# Patient Record
Sex: Male | Born: 1975
Health system: Southern US, Community
[De-identification: ages and names within clinical notes are randomized; demographics above are authoritative.]

## PROBLEM LIST (undated history)

## (undated) DIAGNOSIS — E785 Hyperlipidemia, unspecified: Secondary | ICD-10-CM

## (undated) DIAGNOSIS — G44009 Cluster headache syndrome, unspecified, not intractable: Secondary | ICD-10-CM

## (undated) DIAGNOSIS — F419 Anxiety disorder, unspecified: Secondary | ICD-10-CM

## (undated) HISTORY — PX: WISDOM TOOTH EXTRACTION: SHX21

## (undated) HISTORY — PX: NO PAST SURGERIES: SHX2092

## (undated) HISTORY — DX: Anxiety disorder, unspecified: F41.9

## (undated) HISTORY — DX: Hyperlipidemia, unspecified: E78.5

## (undated) HISTORY — DX: Cluster headache syndrome, unspecified, not intractable: G44.009

---

## 2004-08-21 ENCOUNTER — Observation Stay (HOSPITAL_COMMUNITY): Admission: EM | Admit: 2004-08-21 | Discharge: 2004-08-22 | Payer: Self-pay | Admitting: Emergency Medicine

## 2016-04-24 ENCOUNTER — Ambulatory Visit (INDEPENDENT_AMBULATORY_CARE_PROVIDER_SITE_OTHER): Payer: Managed Care, Other (non HMO) | Admitting: Physician Assistant

## 2016-04-24 ENCOUNTER — Encounter: Payer: Self-pay | Admitting: Physician Assistant

## 2016-04-24 VITALS — BP 118/80 | HR 85 | Temp 98.0°F | Resp 16 | Ht 73.75 in | Wt 228.0 lb

## 2016-04-24 DIAGNOSIS — H6983 Other specified disorders of Eustachian tube, bilateral: Secondary | ICD-10-CM

## 2016-04-24 DIAGNOSIS — E785 Hyperlipidemia, unspecified: Secondary | ICD-10-CM | POA: Diagnosis not present

## 2016-04-24 DIAGNOSIS — F411 Generalized anxiety disorder: Secondary | ICD-10-CM | POA: Diagnosis not present

## 2016-04-24 DIAGNOSIS — R42 Dizziness and giddiness: Secondary | ICD-10-CM | POA: Diagnosis not present

## 2016-04-24 LAB — COMPREHENSIVE METABOLIC PANEL
ALK PHOS: 77 U/L (ref 39–117)
ALT: 12 U/L (ref 0–53)
AST: 12 U/L (ref 0–37)
Albumin: 5 g/dL (ref 3.5–5.2)
BUN: 25 mg/dL — ABNORMAL HIGH (ref 6–23)
CO2: 32 meq/L (ref 19–32)
Calcium: 9.8 mg/dL (ref 8.4–10.5)
Chloride: 101 mEq/L (ref 96–112)
Creatinine, Ser: 1.11 mg/dL (ref 0.40–1.50)
GFR: 78.31 mL/min (ref >60.00)
GLUCOSE: 94 mg/dL (ref 70–99)
POTASSIUM: 4.3 meq/L (ref 3.5–5.1)
Sodium: 139 mEq/L (ref 135–145)
TOTAL PROTEIN: 7.4 g/dL (ref 6.0–8.3)
Total Bilirubin: 1.3 mg/dL — ABNORMAL HIGH (ref 0.2–1.2)

## 2016-04-24 LAB — LIPID PANEL
CHOL/HDL RATIO: 6
Cholesterol: 177 mg/dL (ref 0–200)
HDL: 31.1 mg/dL — AB (ref >39.00)
LDL Cholesterol: 107 mg/dL — ABNORMAL HIGH (ref 0–99)
NONHDL: 146.11
Triglycerides: 197 mg/dL — ABNORMAL HIGH (ref 0.0–149.0)
VLDL: 39.4 mg/dL (ref 0.0–40.0)

## 2016-04-24 MED ORDER — LORAZEPAM 1 MG PO TABS: 1.0000 mg | ORAL_TABLET | Freq: Three times a day (TID) | ORAL | 0 refills | Status: DC

## 2016-04-24 MED ORDER — FLUTICASONE PROPIONATE 50 MCG/ACT NA SUSP: 2.0000 | Freq: Every day | NASAL | 6 refills | Status: DC

## 2016-04-24 MED ORDER — ATORVASTATIN CALCIUM 20 MG PO TABS: 20.0000 mg | ORAL_TABLET | Freq: Every day | ORAL | 1 refills | Status: DC

## 2016-04-24 MED ORDER — SERTRALINE HCL 50 MG PO TABS
50.0000 mg | ORAL_TABLET | Freq: Every day | ORAL | 1 refills | Status: DC
Start: 2016-04-24 — End: 2016-10-24

## 2016-04-24 NOTE — Patient Instructions (Signed)
Please go to the lab for blood work. I will call you with your results.  Please continue chronic medications as directed.  For the dizziness -- there is a bit of fluid behind the ear drums which is likely contributing. Start a Claritin-D once daily. Use the Flonase as directed.  Continue the Epley Manuevers below. Your Ativan can help with dizziness. You can use as directed. Do not drive or operate machinery while on this medication.  If symptoms are not improving, we may need to start oral steroids or get input from an ENT.  Epley Maneuver Self-Care WHAT IS THE EPLEY MANEUVER? The Epley maneuver is an exercise you can do to relieve symptoms of benign paroxysmal positional vertigo (BPPV). This condition is often just referred to as vertigo. BPPV is caused by the movement of tiny crystals (canaliths) inside your inner ear. The accumulation and movement of canaliths in your inner ear causes a sudden spinning sensation (vertigo) when you move your head to certain positions. Vertigo usually lasts about 30 seconds. BPPV usually occurs in just one ear. If you get vertigo when you lie on your left side, you probably have BPPV in your left ear. Your health care provider can tell you which ear is involved.  BPPV may be caused by a head injury. Many people older than 50 get BPPV for unknown reasons. If you have been diagnosed with BPPV, your health care provider may teach you how to do this maneuver. BPPV is not life threatening (benign) and usually goes away in time.  WHEN SHOULD I PERFORM THE EPLEY MANEUVER? You can do this maneuver at home whenever you have symptoms of vertigo. You may do the Epley maneuver up to 3 times a day until your symptoms of vertigo go away. HOW SHOULD I DO THE EPLEY MANEUVER? 1. Sit on the edge of a bed or table with your back straight. Your legs should be extended or hanging over the edge of the bed or table.  2. Turn your head halfway toward the affected ear.  3. Lie  backward quickly with your head turned until you are lying flat on your back. You may want to position a pillow under your shoulders.  4. Hold this position for 30 seconds. You may experience an attack of vertigo. This is normal. Hold this position until the vertigo stops. 5. Then turn your head to the opposite direction until your unaffected ear is facing the floor.  6. Hold this position for 30 seconds. You may experience an attack of vertigo. This is normal. Hold this position until the vertigo stops. 7. Now turn your whole body to the same side as your head. Hold for another 30 seconds.  8. You can then sit back up. ARE THERE RISKS TO THIS MANEUVER? In some cases, you may have other symptoms (such as changes in your vision, weakness, or numbness). If you have these symptoms, stop doing the maneuver and call your health care provider. Even if doing these maneuvers relieves your vertigo, you may still have dizziness. Dizziness is the sensation of light-headedness but without the sensation of movement. Even though the Epley maneuver may relieve your vertigo, it is possible that your symptoms will return within 5 years. WHAT SHOULD I DO AFTER THIS MANEUVER? After doing the Epley maneuver, you can return to your normal activities. Ask your doctor if there is anything you should do at home to prevent vertigo. This may include:  Sleeping with two or more pillows to keep  your head elevated.  Not sleeping on the side of your affected ear.  Getting up slowly from bed.  Avoiding sudden movements during the day.  Avoiding extreme head movement, like looking up or bending over.  Wearing a cervical collar to prevent sudden head movements. WHAT SHOULD I DO IF MY SYMPTOMS GET WORSE? Call your health care provider if your vertigo gets worse. Call your provider right way if you have other symptoms, including:   Nausea.  Vomiting.  Headache.  Weakness.  Numbness.  Vision changes. This  information is not intended to replace advice given to you by your health care provider. Make sure you discuss any questions you have with your health care provider. Document Released: 05/18/2013 Document Reviewed: 05/18/2013 Elsevier Interactive Patient Education  2017 [employer]

## 2016-04-24 NOTE — Progress Notes (Signed)
Patient presents to clinic today to establish care.  Acute Concerns: Patient endorses noting a few days of nausea with associated dizziness/lightheadness noted when turning head or getting up too fast. Endorses residual dry cough from infection he had a couple of weeks ago. Denies chest congestion, sinus pressure, sinus pain. Denies ear pressure or pain. Denies decreased hearing, ear drainage, tinnitus. Denies recent travel or sick contact. Endorses staying well hydrated.   Chronic Issues: Generalized Anxiety Disorder -- patient is currently Sertraline 50 mg daily. Is taking as directed. Denies breakthrough symptoms with this regimen. Denies SI/HI.    Hyperlipidemia -- Atorvastatin 20 mg daily. Taking as directed without side effect. Tries to eat a well-balanced diet.   Health Maintenance: Immunizations -- Flu shot and tetanus up-to-date.  Past Medical History:  Diagnosis Date  . Anxiety   . Hyperlipidemia     Past Surgical History:  Procedure Laterality Date  . NO PAST SURGERIES      No current outpatient prescriptions on file prior to visit.   No current facility-administered medications on file prior to visit.     No Known Allergies  Family History  Problem Relation Age of Onset  . Diabetes Sister     x 2  . Stroke Paternal Grandfather     7040s.    Social History   Social History  . Marital status: Married    Spouse name: N/A  . Number of children: N/A  . Years of education: N/A   Occupational History  . Not on file.   Social History Main Topics  . Smoking status: Never Smoker  . Smokeless tobacco: Never Used  . Alcohol use Yes     Comment: rare   . Drug use: No  . Sexual activity: Yes   Other Topics Concern  . Not on file   Social History Narrative   2 daughters -- healthy.    Review of Systems  Constitutional: Negative for chills and fever.  HENT: Negative for ear discharge, ear pain, hearing loss and tinnitus.   Eyes: Negative for blurred  vision and double vision.  Respiratory: Negative for cough and shortness of breath.   Cardiovascular: Negative for chest pain.  Neurological: Positive for dizziness. Negative for tingling, tremors, loss of consciousness and headaches.   BP 118/80   Pulse 85   Temp 98 F (36.7 C) (Oral)   Resp 16   Ht 6' 1.75" (1.873 m)   Wt 228 lb (103.4 kg)   SpO2 95%   BMI 29.47 kg/m   Physical Exam  Constitutional: He is oriented to person, place, and time and well-developed, well-nourished, and in no distress.  HENT:  Head: Normocephalic and atraumatic.  Right Ear: Tympanic membrane is not erythematous and not bulging. A middle ear effusion is present.  Left Ear: Tympanic membrane is not erythematous and not bulging. A middle ear effusion is present.  Nose: Nose normal.  Mouth/Throat: Uvula is midline, oropharynx is clear and moist and mucous membranes are normal.  Negative nystagmus with Dix Hallpike maneuvers but subjective dizziness noted.  Eyes: Conjunctivae are normal.  Neck: Neck supple.  Cardiovascular: Normal rate, regular rhythm, normal heart sounds and intact distal pulses.   Pulmonary/Chest: Effort normal and breath sounds normal. No respiratory distress. He has no wheezes. He has no rales. He exhibits no tenderness.  Neurological: He is alert and oriented to person, place, and time.  Skin: Skin is warm and dry. No rash noted.  Psychiatric: Affect normal.  Vitals  reviewed.  Assessment/Plan: Vertigo Positional with ETD. Start Flonase, Claritin-D and Ecologistpley maneuvers. Supportive measures reviewed. ENT if not improving.   Hyperlipidemia Tolerating statin. Diet and exercise reviewed. Will check lipids and lfts today.  Generalized anxiety disorder Doing very well. Continue current medication regimen. FU 6 months.   Eustachian tube dysfunction, bilateral Will begin Flonase and Claritin-D. If not improving, will consider oral steroid versus ENT assessment.     Piedad ClimesMartin, Bracen Schum  Cody, PA-C

## 2016-04-24 NOTE — Assessment & Plan Note (Signed)
Positional with ETD. Start Flonase, Claritin-D and Ecologistpley maneuvers. Supportive measures reviewed. ENT if not improving.

## 2016-04-24 NOTE — Progress Notes (Signed)
Pre visit review using our clinic review tool, if applicable. No additional management support is needed unless otherwise documented below in the visit note. 

## 2016-04-25 NOTE — Assessment & Plan Note (Signed)
Tolerating statin. Diet and exercise reviewed. Will check lipids and lfts today.

## 2016-04-25 NOTE — Assessment & Plan Note (Signed)
Will begin Flonase and Claritin-D. If not improving, will consider oral steroid versus ENT assessment.

## 2016-04-25 NOTE — Assessment & Plan Note (Signed)
Doing very well. Continue current medication regimen. FU 6 months.

## 2016-04-30 ENCOUNTER — Telehealth: Payer: Self-pay | Admitting: Physician Assistant

## 2016-04-30 DIAGNOSIS — R42 Dizziness and giddiness: Secondary | ICD-10-CM

## 2016-04-30 NOTE — Telephone Encounter (Signed)
Pt states that he is still having vertigo and [facility]

## 2016-05-01 NOTE — Telephone Encounter (Signed)
Referral placed and scheduler will contact patient to set up appointment.

## 2016-05-01 NOTE — Telephone Encounter (Signed)
Per last visit if symptoms not improving referral to ENT

## 2016-05-01 NOTE — Telephone Encounter (Signed)
Ok to place referral for patient ?

## 2016-05-13 ENCOUNTER — Encounter: Payer: Self-pay | Admitting: Physician Assistant

## 2016-05-13 ENCOUNTER — Ambulatory Visit (INDEPENDENT_AMBULATORY_CARE_PROVIDER_SITE_OTHER): Payer: Managed Care, Other (non HMO) | Admitting: Physician Assistant

## 2016-05-13 VITALS — BP 110/78 | HR 78 | Temp 98.2°F | Resp 16 | Ht 73.75 in | Wt 225.0 lb

## 2016-05-13 DIAGNOSIS — R61 Generalized hyperhidrosis: Secondary | ICD-10-CM

## 2016-05-13 LAB — URINALYSIS, ROUTINE W REFLEX MICROSCOPIC
BILIRUBIN URINE: NEGATIVE
HGB URINE DIPSTICK: NEGATIVE
Ketones, ur: NEGATIVE
LEUKOCYTES UA: NEGATIVE
NITRITE: NEGATIVE
RBC / HPF: NONE SEEN (ref 0–?)
Specific Gravity, Urine: >=1.03 — AB (ref 1.000–1.030)
Total Protein, Urine: NEGATIVE
Urine Glucose: NEGATIVE
Urobilinogen, UA: 0.2 (ref 0.0–1.0)
pH: 5.5 (ref 5.0–8.0)

## 2016-05-13 LAB — CBC WITH DIFFERENTIAL/PLATELET
BASOS ABS: 0 10*3/uL (ref 0.0–0.1)
BASOS PCT: 0.6 % (ref 0.0–3.0)
EOS ABS: 0.1 10*3/uL (ref 0.0–0.7)
Eosinophils Relative: 3 % (ref 0.0–5.0)
HCT: 40.6 % (ref 39.0–52.0)
HEMOGLOBIN: 13.9 g/dL (ref 13.0–17.0)
LYMPHS PCT: 36.1 % (ref 12.0–46.0)
Lymphs Abs: 1.7 10*3/uL (ref 0.7–4.0)
MCHC: 34.3 g/dL (ref 30.0–36.0)
MCV: 86.5 fl (ref 78.0–100.0)
MONO ABS: 0.3 10*3/uL (ref 0.1–1.0)
Monocytes Relative: 5.9 % (ref 3.0–12.0)
Neutro Abs: 2.5 10*3/uL (ref 1.4–7.7)
Neutrophils Relative %: 54.4 % (ref 43.0–77.0)
Platelets: 181 10*3/uL (ref 150.0–400.0)
RBC: 4.7 Mil/uL (ref 4.22–5.81)
RDW: 12.4 % (ref 11.5–15.5)
WBC: 4.7 10*3/uL (ref 4.0–10.5)

## 2016-05-13 LAB — COMPREHENSIVE METABOLIC PANEL
ALT: 12 U/L (ref 0–53)
AST: 13 U/L (ref 0–37)
Albumin: 4.7 g/dL (ref 3.5–5.2)
Alkaline Phosphatase: 78 U/L (ref 39–117)
BILIRUBIN TOTAL: 0.9 mg/dL (ref 0.2–1.2)
BUN: 25 mg/dL — AB (ref 6–23)
CO2: 30 meq/L (ref 19–32)
Calcium: 9.6 mg/dL (ref 8.4–10.5)
Chloride: 102 mEq/L (ref 96–112)
Creatinine, Ser: 1 mg/dL (ref 0.40–1.50)
GFR: 88.31 mL/min (ref >60.00)
GLUCOSE: 103 mg/dL — AB (ref 70–99)
POTASSIUM: 4.4 meq/L (ref 3.5–5.1)
SODIUM: 140 meq/L (ref 135–145)
Total Protein: 6.8 g/dL (ref 6.0–8.3)

## 2016-05-13 LAB — TSH: TSH: 1.35 u[IU]/mL (ref 0.35–4.50)

## 2016-05-13 LAB — SEDIMENTATION RATE: Sed Rate: 2 mm/hr (ref 0–15)

## 2016-05-13 LAB — T4, FREE: FREE T4: 0.74 ng/dL (ref 0.60–1.60)

## 2016-05-13 NOTE — Patient Instructions (Signed)
Please go to the lab for blood work. I will call with your results.  Start a journal of the dates you are noting the night sweats. Also check your temperature at bedtime over the next week and anytime you have sweats so we can see if you are spiking a fever at night.   We will proceed with further assessment based on your results.  Please keep us informed if there are any new symptoms noted.

## 2016-05-13 NOTE — Progress Notes (Signed)
Pre visit review using our clinic review tool, if applicable. No additional management support is needed unless otherwise documented below in the visit note. 

## 2016-05-13 NOTE — Progress Notes (Signed)
Patient presents to clinic today c/o night sweats occurring several times per week over the past month. Endorses bed completely drenched. Denies fever, chills. Denies chest pain, SOB, racing heart. Endorses weight loss over the past 1-1.5 months -- Endorses about 20 pound weight loss.  Denies recent travel or sick contact. States symptoms started after he got a cold. Denies any residual URI symptoms. Denies change to bowel/bladder habits. Denies melena, tenesmus or hematochezia. Denies anorexia.   Past Medical History:  Diagnosis Date  . Anxiety   . Hyperlipidemia     Current Outpatient Prescriptions on File Prior to Visit  Medication Sig Dispense Refill  . atorvastatin (LIPITOR) 20 MG tablet Take 1 tablet (20 mg total) by mouth daily. 90 tablet 1  . LORazepam (ATIVAN) 1 MG tablet Take 1 tablet (1 mg total) by mouth every 8 (eight) hours. 30 tablet 0  . sertraline (ZOLOFT) 50 MG tablet Take 1 tablet (50 mg total) by mouth daily. 90 tablet 1   No current facility-administered medications on file prior to visit.     No Known Allergies  Family History  Problem Relation Age of Onset  . Diabetes Sister     x 2  . Stroke Paternal Grandfather     7s.    Social History   Social History  . Marital status: Married    Spouse name: N/A  . Number of children: N/A  . Years of education: N/A   Social History Main Topics  . Smoking status: Never Smoker  . Smokeless tobacco: Never Used  . Alcohol use Yes     Comment: rare   . Drug use: No  . Sexual activity: Yes   Other Topics Concern  . None   Social History Narrative   2 daughters -- healthy.    Review of Systems - See HPI.  All other ROS are negative.  BP 110/78   Pulse 78   Temp 98.2 F (36.8 C) (Oral)   Resp 16   Ht 6' 1.75" (1.873 m)   Wt 225 lb (102.1 kg)   SpO2 95%   BMI 29.08 kg/m   Physical Exam  Constitutional: He is oriented to person, place, and time and well-developed, well-nourished, and in no  distress.  HENT:  Head: Normocephalic and atraumatic.  Right Ear: External ear normal.  Left Ear: External ear normal.  Nose: Nose normal.  Mouth/Throat: Oropharynx is clear and moist. No oropharyngeal exudate.  TM within normal limits bilaterally.   Eyes: Conjunctivae are normal.  Neck: Neck supple. No thyromegaly present.  Cardiovascular: Normal rate, regular rhythm, normal heart sounds and intact distal pulses.   Pulmonary/Chest: Effort normal and breath sounds normal. No respiratory distress. He has no wheezes. He has no rales. He exhibits no tenderness.  Lymphadenopathy:    He has no cervical adenopathy.  Neurological: He is alert and oriented to person, place, and time.  Skin: Skin is warm and dry. No rash noted.  Psychiatric: Affect normal.  Vitals reviewed.   Recent Results (from the past 2160 hour(s))  Lipid Profile     Status: Abnormal   Collection Time: 04/24/16  2:58 PM  Result Value Ref Range   Cholesterol 177 0 - 200 mg/dL    Comment: ATP III Classification       Desirable:  < 200 mg/dL               Borderline High:  200 - 239 mg/dL  High:  > = 240 mg/dL   Triglycerides 197.0 (H) 0.0 - 149.0 mg/dL    Comment: Normal:  <150 mg/dLBorderline High:  150 - 199 mg/dL   HDL 31.10 (L) >39.00 mg/dL   VLDL 39.4 0.0 - 40.0 mg/dL   LDL Cholesterol 107 (H) 0 - 99 mg/dL   Total CHOL/HDL Ratio 6     Comment:                Men          Women1/2 Average Risk     3.4          3.3Average Risk          5.0          4.42X Average Risk          9.6          7.13X Average Risk          15.0          11.0                       NonHDL 146.11     Comment: NOTE:  Non-HDL goal should be 30 mg/dL higher than patient's LDL goal (i.e. LDL goal of < 70 mg/dL, would have non-HDL goal of < 100 mg/dL)  Comp Met (CMET)     Status: Abnormal   Collection Time: 04/24/16  2:58 PM  Result Value Ref Range   Sodium 139 135 - 145 mEq/L   Potassium 4.3 3.5 - 5.1 mEq/L   Chloride 101 96 - 112 mEq/L    CO2 32 19 - 32 mEq/L   Glucose, Bld 94 70 - 99 mg/dL   BUN 25 (H) 6 - 23 mg/dL   Creatinine, Ser 1.11 0.40 - 1.50 mg/dL   Total Bilirubin 1.3 (H) 0.2 - 1.2 mg/dL   Alkaline Phosphatase 77 39 - 117 U/L   AST 12 0 - 37 U/L   ALT 12 0 - 53 U/L   Total Protein 7.4 6.0 - 8.3 g/dL   Albumin 5.0 3.5 - 5.2 g/dL   Calcium 9.8 8.4 - 10.5 mg/dL   GFR 78.31 >60.00 mL/min    Assessment/Plan: 1. Night sweats Unclear. Recent viral URI. Potential relation. Will check lab panel today to include CBC, TSH, CMP, ESR and EBV panel. Patient to keep a journal to log sweats. Also check temperature each evening at bedtime and record over the next week.  - CBC w/Diff - TSH - T4, free - Comp Met (CMET) - Urinalysis, Routine w reflex microscopic - Sed Rate (ESR) - Epstein-Barr virus VCA antibody panel   Leeanne Rio, PA-C

## 2016-05-14 LAB — EPSTEIN-BARR VIRUS VCA ANTIBODY PANEL
EBV NA IGG: 23.8 U/mL — AB
EBV VCA IGG: 110 U/mL — AB

## 2016-05-15 ENCOUNTER — Other Ambulatory Visit: Payer: Self-pay | Admitting: Physician Assistant

## 2016-05-15 ENCOUNTER — Encounter: Payer: Self-pay | Admitting: Physician Assistant

## 2016-05-15 DIAGNOSIS — R61 Generalized hyperhidrosis: Secondary | ICD-10-CM

## 2016-05-16 ENCOUNTER — Ambulatory Visit
Admission: RE | Admit: 2016-05-16 | Discharge: 2016-05-16 | Disposition: A | Discharge status: Home / Self Care | Payer: Managed Care, Other (non HMO) | Source: Ambulatory Visit | Attending: Physician Assistant | Admitting: Physician Assistant

## 2016-05-16 DIAGNOSIS — R61 Generalized hyperhidrosis: Secondary | ICD-10-CM

## 2016-10-24 ENCOUNTER — Encounter: Payer: Self-pay | Admitting: Physician Assistant

## 2016-10-24 ENCOUNTER — Ambulatory Visit (INDEPENDENT_AMBULATORY_CARE_PROVIDER_SITE_OTHER): Payer: Managed Care, Other (non HMO) | Admitting: Physician Assistant

## 2016-10-24 VITALS — BP 110/78 | HR 58 | Temp 97.7°F | Resp 14 | Ht 73.75 in | Wt 225.0 lb

## 2016-10-24 DIAGNOSIS — F411 Generalized anxiety disorder: Secondary | ICD-10-CM | POA: Diagnosis not present

## 2016-10-24 DIAGNOSIS — Z0000 Encounter for general adult medical examination without abnormal findings: Secondary | ICD-10-CM | POA: Diagnosis not present

## 2016-10-24 DIAGNOSIS — Z6829 Body mass index (BMI) 29.0-29.9, adult: Secondary | ICD-10-CM | POA: Diagnosis not present

## 2016-10-24 DIAGNOSIS — E785 Hyperlipidemia, unspecified: Secondary | ICD-10-CM | POA: Diagnosis not present

## 2016-10-24 LAB — URINALYSIS, ROUTINE W REFLEX MICROSCOPIC
BILIRUBIN URINE: NEGATIVE
Hgb urine dipstick: NEGATIVE
KETONES UR: NEGATIVE
LEUKOCYTES UA: NEGATIVE
NITRITE: NEGATIVE
PH: 6 (ref 5.0–8.0)
RBC / HPF: NONE SEEN (ref 0–?)
Specific Gravity, Urine: >=1.03 — AB (ref 1.000–1.030)
TOTAL PROTEIN, URINE-UPE24: NEGATIVE
URINE GLUCOSE: NEGATIVE
UROBILINOGEN UA: 0.2 (ref 0.0–1.0)

## 2016-10-24 LAB — COMPREHENSIVE METABOLIC PANEL
ALBUMIN: 4.9 g/dL (ref 3.5–5.2)
ALK PHOS: 93 U/L (ref 39–117)
ALT: 14 U/L (ref 0–53)
AST: 16 U/L (ref 0–37)
BILIRUBIN TOTAL: 1.3 mg/dL — AB (ref 0.2–1.2)
BUN: 25 mg/dL — AB (ref 6–23)
CO2: 33 mEq/L — ABNORMAL HIGH (ref 19–32)
Calcium: 9.8 mg/dL (ref 8.4–10.5)
Chloride: 102 mEq/L (ref 96–112)
Creatinine, Ser: 0.96 mg/dL (ref 0.40–1.50)
GFR: 92.36 mL/min (ref >60.00)
GLUCOSE: 88 mg/dL (ref 70–99)
Potassium: 4.6 mEq/L (ref 3.5–5.1)
SODIUM: 139 meq/L (ref 135–145)
TOTAL PROTEIN: 7.2 g/dL (ref 6.0–8.3)

## 2016-10-24 LAB — LIPID PANEL
CHOLESTEROL: 186 mg/dL (ref 0–200)
HDL: 42 mg/dL (ref >39.00)
LDL Cholesterol: 104 mg/dL — ABNORMAL HIGH (ref 0–99)
NONHDL: 143.87
Total CHOL/HDL Ratio: 4
Triglycerides: 199 mg/dL — ABNORMAL HIGH (ref 0.0–149.0)
VLDL: 39.8 mg/dL (ref 0.0–40.0)

## 2016-10-24 LAB — CBC
HEMATOCRIT: 41.5 % (ref 39.0–52.0)
HEMOGLOBIN: 14 g/dL (ref 13.0–17.0)
MCHC: 33.8 g/dL (ref 30.0–36.0)
MCV: 88.2 fl (ref 78.0–100.0)
Platelets: 200 10*3/uL (ref 150.0–400.0)
RBC: 4.71 Mil/uL (ref 4.22–5.81)
RDW: 12.9 % (ref 11.5–15.5)
WBC: 5.6 10*3/uL (ref 4.0–10.5)

## 2016-10-24 LAB — HEMOGLOBIN A1C: HEMOGLOBIN A1C: 5.7 % (ref 4.6–6.5)

## 2016-10-24 MED ORDER — SERTRALINE HCL 50 MG PO TABS
50.0000 mg | ORAL_TABLET | Freq: Every day | ORAL | 1 refills | Status: DC
Start: 2016-10-24 — End: 2017-05-30

## 2016-10-24 NOTE — Assessment & Plan Note (Signed)
Depression screen negative. Health Maintenance reviewed -- immunizations up-to-date. Preventive schedule discussed and handout given in AVS. Will obtain fasting labs today.  

## 2016-10-24 NOTE — Assessment & Plan Note (Signed)
Very active. Working on diet. Will follow.

## 2016-10-24 NOTE — Assessment & Plan Note (Signed)
Taking statin as directed. Tolerating without side effect. Will check fasting labs today. Will alter regimen accordingly. Dietary and exercise recommendations reviewed with patient. Follow-up every 6 months.

## 2016-10-24 NOTE — Patient Instructions (Signed)
Please go to the lab for blood work.   Our office will call you with your results unless you have chosen to receive results via MyChart.  If your blood work is normal we will follow-up each year for physicals and as scheduled for chronic medical problems.  If anything is abnormal we will treat accordingly and get you in for a follow-up.  Continue medications as directed.   Preventive Care 18-39 Years, Male Preventive care refers to lifestyle choices and visits with your health care provider that can promote health and wellness. What does preventive care include?  A yearly physical exam. This is also called an annual well check.  Dental exams once or twice a year.  Routine eye exams. Ask your health care provider how often you should have your eyes checked.  Personal lifestyle choices, including: ? Daily care of your teeth and gums. ? Regular physical activity. ? Eating a healthy diet. ? Avoiding tobacco and drug use. ? Limiting alcohol use. ? Practicing safe sex. What happens during an annual well check? The services and screenings done by your health care provider during your annual well check will depend on your age, overall health, lifestyle risk factors, and family history of disease. Counseling Your health care provider may ask you questions about your:  Alcohol use.  Tobacco use.  Drug use.  Emotional well-being.  Home and relationship well-being.  Sexual activity.  Eating habits.  Work and work Statistician.  Screening You may have the following tests or measurements:  Height, weight, and BMI.  Blood pressure.  Lipid and cholesterol levels. These may be checked every 5 years starting at age 28.  Diabetes screening. This is done by checking your blood sugar (glucose) after you have not eaten for a while (fasting).  Skin check.  Hepatitis C blood test.  Hepatitis B blood test.  Sexually transmitted disease (STD) testing.  Discuss your test  results, treatment options, and if necessary, the need for more tests with your health care provider. Vaccines Your health care provider may recommend certain vaccines, such as:  Influenza vaccine. This is recommended every year.  Tetanus, diphtheria, and acellular pertussis (Tdap, Td) vaccine. You may need a Td booster every 10 years.  Varicella vaccine. You may need this if you have not been vaccinated.  HPV vaccine. If you are 20 or younger, you may need three doses over 6 months.  Measles, mumps, and rubella (MMR) vaccine. You may need at least one dose of MMR.You may also need a second dose.  Pneumococcal 13-valent conjugate (PCV13) vaccine. You may need this if you have certain conditions and have not been vaccinated.  Pneumococcal polysaccharide (PPSV23) vaccine. You may need one or two doses if you smoke cigarettes or if you have certain conditions.  Meningococcal vaccine. One dose is recommended if you are age 47-21 years and a first-year college student living in a residence hall, or if you have one of several medical conditions. You may also need additional booster doses.  Hepatitis A vaccine. You may need this if you have certain conditions or if you travel or work in places where you may be exposed to hepatitis A.  Hepatitis B vaccine. You may need this if you have certain conditions or if you travel or work in places where you may be exposed to hepatitis B.  Haemophilus influenzae type b (Hib) vaccine. You may need this if you have certain risk factors.  Talk to your health care provider about which screenings  and vaccines you need and how often you need them. This information is not intended to replace advice given to you by your health care provider. Make sure you discuss any questions you have with your health care provider. Document Released: 07/09/2001 Document Revised: 01/31/2016 Document Reviewed: 03/14/2015 Elsevier Interactive Patient Education  2017 [employer].

## 2016-10-24 NOTE — Assessment & Plan Note (Signed)
Doing very well on current regimen. No alarm signs/symptoms. Continue current regimen.

## 2016-10-24 NOTE — Progress Notes (Signed)
Patient presents to clinic today for annual exam.  Patient is fasting for labs. Diet -- Endorses well-balanced diet overall. Denies intake of fast food. Watching portion sizes.  Exercise -- Plays hockey and softball weekly. [place] in between.  Body mass index is 29.08 kg/m.  Acute Concerns: Denies acute concerns at today's visit.  Chronic Issues: Anxiety -- Is currently on a regimen. Denies SI/HI.   Hyperlipidemia -- Taking Lipitor 20 mg daily without side effect. Is not currently on ASA. Is staying active and watching diet.   Health Maintenance: Immunizations -- up-to-date   Past Medical History:  Diagnosis Date  . Anxiety   . Hyperlipidemia     Past Surgical History:  Procedure Laterality Date  . NO PAST SURGERIES      Current Outpatient Prescriptions on File Prior to Visit  Medication Sig Dispense Refill  . atorvastatin (LIPITOR) 20 MG tablet Take 1 tablet (20 mg total) by mouth daily. 90 tablet 1  . LORazepam (ATIVAN) 1 MG tablet Take 1 tablet (1 mg total) by mouth every 8 (eight) hours. 30 tablet 0  . sertraline (ZOLOFT) 50 MG tablet Take 1 tablet (50 mg total) by mouth daily. 90 tablet 1   No current facility-administered medications on file prior to visit.     No Known Allergies  Family History  Problem Relation Age of Onset  . Diabetes Sister        x 2  . Stroke Paternal Grandfather        440s.    Social History   Social History  . Marital status: Married    Spouse name: N/A  . Number of children: N/A  . Years of education: N/A   Occupational History  . Not on file.   Social History Main Topics  . Smoking status: Never Smoker  . Smokeless tobacco: Never Used  . Alcohol use Yes     Comment: rare   . Drug use: No  . Sexual activity: Yes   Other Topics Concern  . Not on file   Social History Narrative   2 daughters -- healthy.    Review of Systems  Constitutional: Negative for fever and weight loss.  HENT: Negative for ear  discharge, ear pain, hearing loss and tinnitus.   Eyes: Negative for blurred vision, double vision, photophobia and pain.  Respiratory: Negative for cough and shortness of breath.   Cardiovascular: Negative for chest pain and palpitations.  Gastrointestinal: Negative for abdominal pain, blood in stool, constipation, diarrhea, heartburn, melena, nausea and vomiting.  Genitourinary: Negative for dysuria, flank pain, frequency, hematuria and urgency.  Musculoskeletal: Negative for falls.  Neurological: Negative for dizziness, loss of consciousness and headaches.  Endo/Heme/Allergies: Negative for environmental allergies.  Psychiatric/Behavioral: Negative for depression, hallucinations, substance abuse and suicidal ideas. The patient is not nervous/anxious and does not have insomnia.    BP 110/78   Pulse (!) 58   Temp 97.7 F (36.5 C) (Oral)   Resp 14   Ht 6' 1.75" (1.873 m)   Wt 225 lb (102.1 kg)   SpO2 98%   BMI 29.08 kg/m   Physical Exam  Constitutional: He is oriented to person, place, and time and well-developed, well-nourished, and in no distress.  HENT:  Head: Normocephalic and atraumatic.  Right Ear: External ear normal.  Left Ear: External ear normal.  Nose: Nose normal.  Mouth/Throat: Oropharynx is clear and moist. No oropharyngeal exudate.  Eyes: Conjunctivae and EOM are normal. Pupils are equal, round, and  reactive to light.  Neck: Neck supple. No thyromegaly present.  Cardiovascular: Normal rate, regular rhythm, normal heart sounds and intact distal pulses.   Pulmonary/Chest: Effort normal and breath sounds normal. No respiratory distress. He has no wheezes. He has no rales. He exhibits no tenderness.  Abdominal: Soft. Bowel sounds are normal. He exhibits no distension and no mass. There is no tenderness. There is no rebound and no guarding.  Genitourinary: Testes/scrotum normal and penis normal. No discharge found.  Lymphadenopathy:    He has no cervical adenopathy.    Neurological: He is alert and oriented to person, place, and time.  Skin: Skin is warm and dry. No rash noted.  Psychiatric: Affect normal.  Vitals reviewed.  Assessment/Plan: Visit for preventive health examination Depression screen negative. Health Maintenance reviewed -- immunizations up-to-date. Preventive schedule discussed and handout given in AVS. Will obtain fasting labs today.   Hyperlipidemia Taking statin as directed. Tolerating without side effect. Will check fasting labs today. Will alter regimen accordingly. Dietary and exercise recommendations reviewed with patient. Follow-up every 6 months.  Generalized anxiety disorder Doing very well on current regimen. No alarm signs/symptoms. Continue current regimen.   BMI 29.0-29.9,adult Very active. Working on diet. Will follow.     Piedad Climes, PA-C

## 2016-10-24 NOTE — Progress Notes (Signed)
Pre visit review using our clinic review tool, if applicable. No additional management support is needed unless otherwise documented below in the visit note. 

## 2017-01-25 ENCOUNTER — Other Ambulatory Visit: Payer: Self-pay | Admitting: Physician Assistant

## 2017-01-25 DIAGNOSIS — E785 Hyperlipidemia, unspecified: Secondary | ICD-10-CM

## 2017-05-28 ENCOUNTER — Other Ambulatory Visit: Payer: Self-pay

## 2017-05-28 ENCOUNTER — Ambulatory Visit (INDEPENDENT_AMBULATORY_CARE_PROVIDER_SITE_OTHER): Payer: Managed Care, Other (non HMO) | Admitting: Physician Assistant

## 2017-05-28 ENCOUNTER — Encounter: Payer: Self-pay | Admitting: Physician Assistant

## 2017-05-28 VITALS — BP 118/70 | HR 91 | Temp 97.9°F | Resp 14 | Ht 73.75 in | Wt 235.0 lb

## 2017-05-28 DIAGNOSIS — E785 Hyperlipidemia, unspecified: Secondary | ICD-10-CM | POA: Diagnosis not present

## 2017-05-28 DIAGNOSIS — G44019 Episodic cluster headache, not intractable: Secondary | ICD-10-CM | POA: Diagnosis not present

## 2017-05-28 DIAGNOSIS — F411 Generalized anxiety disorder: Secondary | ICD-10-CM

## 2017-05-28 MED ORDER — METHYLPREDNISOLONE 4 MG PO TBPK: ORAL_TABLET | ORAL | 0 refills | Status: DC

## 2017-05-28 MED ORDER — SUMATRIPTAN SUCCINATE 50 MG PO TABS: 50.0000 mg | ORAL_TABLET | Freq: Once | ORAL | 0 refills | Status: DC

## 2017-05-28 NOTE — Patient Instructions (Signed)
Please return tomorrow morning for fasting labs. I will call with results and print out refills for you to pick up.  For the headaches, make sure to keep well-hydrated. Start the steroid taper as directed. Imitrex as directed if needed for acute headache. If still occurring we will need Neurology assessment.

## 2017-05-28 NOTE — Assessment & Plan Note (Signed)
Doing very well. Continue current regimen.  

## 2017-05-28 NOTE — Progress Notes (Signed)
Patient presents to clinic today for follow-up of chronic issues. Body mass index is 30.38 kg/m. Diet -- Endorses well-balanced diet overall. Is working on portion sizes. Exercise -- Hockey 2-3 x week.  Hyperlipidemia -- Is taking Atorvastatin 20 mg daily. Is playing hockey 2-3 x week. Is working on increased exercise.   Generalized Anxiety Disorder -- Doing very well on current regimen of Sertraline 50 mg daily. Has Ativan for PRN use but has not needed in > 1 year. Denies depressed mood or anhedonia. Denies SI/HI.   Patient also with acute concerns today. Patient endorses history of cluster headaches, starting in his mid 62s. Has become less of a frequent issue with last cluster over a year ago. Endorses daily headaches over the past few days occurring behind the right eye, always occurring at the same time of day and associated with redness of eye and eye tearing. Was seen by Neurology previously and given steroid taper and imitrex which works very well. Patient denies any vision changes, AMS or abnormal coordination. Denies photophobia, phonophobia, nausea or vomiting.  Past Medical History:  Diagnosis Date  . Anxiety   . Cluster headaches   . Hyperlipidemia     Current Outpatient Medications on File Prior to Visit  Medication Sig Dispense Refill  . atorvastatin (LIPITOR) 20 MG tablet TAKE 1 TABLET(20 MG) BY MOUTH DAILY 90 tablet 1  . LORazepam (ATIVAN) 1 MG tablet Take 1 tablet (1 mg total) by mouth every 8 (eight) hours. 30 tablet 0  . PROAIR HFA 108 (90 Base) MCG/ACT inhaler INHALE 2 PUFFS Q 4 H FOR 7 DAYS  0  . sertraline (ZOLOFT) 50 MG tablet Take 1 tablet (50 mg total) by mouth daily. 90 tablet 1   No current facility-administered medications on file prior to visit.     No Known Allergies  Family History  Problem Relation Age of Onset  . Diabetes Sister        x 2  . Stroke Paternal Grandfather        79s.    Social History   Socioeconomic History  . Marital  status: Married    Spouse name: None  . Number of children: None  . Years of education: None  . Highest education level: None  Social Needs  . Financial resource strain: None  . Food insecurity - worry: None  . Food insecurity - inability: None  . Transportation needs - medical: None  . Transportation needs - non-medical: None  Occupational History  . None  Tobacco Use  . Smoking status: Never Smoker  . Smokeless tobacco: Never Used  Substance and Sexual Activity  . Alcohol use: Yes    Comment: rare   . Drug use: No  . Sexual activity: Yes  Other Topics Concern  . None  Social History Narrative   2 daughters -- healthy.    Review of Systems - See HPI.  All other ROS are negative.  BP 118/70   Pulse 91   Temp 97.9 F (36.6 C) (Oral)   Resp 14   Ht 6' 1.75" (1.873 m)   Wt 235 lb (106.6 kg)   SpO2 98%   BMI 30.38 kg/m   Physical Exam  Constitutional: He is oriented to person, place, and time and well-developed, well-nourished, and in no distress.  HENT:  Head: Normocephalic and atraumatic.  Right Ear: External ear normal.  Left Ear: External ear normal.  Nose: Nose normal.  Mouth/Throat: Oropharynx is clear and moist. No  oropharyngeal exudate.  TM within normal limits bilaterally.  Eyes: Conjunctivae are normal.  Neck: Neck supple.  Cardiovascular: Normal rate, regular rhythm, normal heart sounds and intact distal pulses.  Pulmonary/Chest: Effort normal and breath sounds normal. No respiratory distress. He has no wheezes. He has no rales. He exhibits no tenderness.  Neurological: He is alert and oriented to person, place, and time.  Skin: Skin is warm and dry. No rash noted.  Psychiatric: Affect normal.  Vitals reviewed.  Assessment/Plan: Episodic cluster headache, not intractable Neuro exam without abnormal findings. Medrol taper. Imitrex for abortive therapy. Supportive measures reviewed. Neuro referral if not resolving.  Hyperlipidemia Orders for fasting  lipids placed. Dietary and exercise regimen reviewed.  Generalized anxiety disorder Doing very well. Continue current regimen.    Piedad ClimesWilliam Cody Merilynn Haydu, PA-C

## 2017-05-28 NOTE — Assessment & Plan Note (Signed)
Orders for fasting lipids placed. Dietary and exercise regimen reviewed.

## 2017-05-28 NOTE — Assessment & Plan Note (Signed)
Neuro exam without abnormal findings. Medrol taper. Imitrex for abortive therapy. Supportive measures reviewed. Neuro referral if not resolving.

## 2017-05-29 ENCOUNTER — Other Ambulatory Visit (INDEPENDENT_AMBULATORY_CARE_PROVIDER_SITE_OTHER): Payer: Managed Care, Other (non HMO)

## 2017-05-29 ENCOUNTER — Other Ambulatory Visit: Payer: Self-pay | Admitting: Physician Assistant

## 2017-05-29 DIAGNOSIS — E785 Hyperlipidemia, unspecified: Secondary | ICD-10-CM | POA: Diagnosis not present

## 2017-05-29 DIAGNOSIS — R7309 Other abnormal glucose: Secondary | ICD-10-CM

## 2017-05-29 LAB — COMPREHENSIVE METABOLIC PANEL
ALBUMIN: 4.9 g/dL (ref 3.5–5.2)
ALK PHOS: 84 U/L (ref 39–117)
ALT: 35 U/L (ref 0–53)
AST: 27 U/L (ref 0–37)
BILIRUBIN TOTAL: 1 mg/dL (ref 0.2–1.2)
BUN: 22 mg/dL (ref 6–23)
CO2: 29 mEq/L (ref 19–32)
CREATININE: 1 mg/dL (ref 0.40–1.50)
Calcium: 10.2 mg/dL (ref 8.4–10.5)
Chloride: 101 mEq/L (ref 96–112)
GFR: 87.84 mL/min (ref >60.00)
Glucose, Bld: 125 mg/dL — ABNORMAL HIGH (ref 70–99)
Potassium: 5 mEq/L (ref 3.5–5.1)
SODIUM: 138 meq/L (ref 135–145)
TOTAL PROTEIN: 7.4 g/dL (ref 6.0–8.3)

## 2017-05-29 LAB — LIPID PANEL
CHOLESTEROL: 213 mg/dL — AB (ref 0–200)
HDL: 48.8 mg/dL (ref >39.00)
LDL Cholesterol: 143 mg/dL — ABNORMAL HIGH (ref 0–99)
NonHDL: 164.55
Total CHOL/HDL Ratio: 4
Triglycerides: 107 mg/dL (ref 0.0–149.0)
VLDL: 21.4 mg/dL (ref 0.0–40.0)

## 2017-05-29 LAB — HEMOGLOBIN A1C: HEMOGLOBIN A1C: 5.7 % (ref 4.6–6.5)

## 2017-05-30 ENCOUNTER — Encounter: Payer: Self-pay | Admitting: Emergency Medicine

## 2017-05-30 ENCOUNTER — Other Ambulatory Visit: Payer: Self-pay | Admitting: Physician Assistant

## 2017-05-30 DIAGNOSIS — F411 Generalized anxiety disorder: Secondary | ICD-10-CM

## 2017-05-30 DIAGNOSIS — E785 Hyperlipidemia, unspecified: Secondary | ICD-10-CM

## 2017-05-30 MED ORDER — ATORVASTATIN CALCIUM 40 MG PO TABS: 40.0000 mg | ORAL_TABLET | Freq: Every day | ORAL | 0 refills | Status: DC

## 2017-05-30 MED ORDER — SERTRALINE HCL 50 MG PO TABS: 50.0000 mg | ORAL_TABLET | Freq: Every day | ORAL | 1 refills | Status: DC

## 2017-06-20 ENCOUNTER — Other Ambulatory Visit: Payer: Self-pay | Admitting: Physician Assistant

## 2017-06-20 DIAGNOSIS — F411 Generalized anxiety disorder: Secondary | ICD-10-CM

## 2017-07-07 ENCOUNTER — Other Ambulatory Visit (INDEPENDENT_AMBULATORY_CARE_PROVIDER_SITE_OTHER): Payer: Self-pay

## 2017-07-07 DIAGNOSIS — E785 Hyperlipidemia, unspecified: Secondary | ICD-10-CM

## 2017-07-07 LAB — HEPATIC FUNCTION PANEL
ALT: 23 U/L (ref 0–53)
AST: 15 U/L (ref 0–37)
Albumin: 4.5 g/dL (ref 3.5–5.2)
Alkaline Phosphatase: 91 U/L (ref 39–117)
BILIRUBIN DIRECT: 0.1 mg/dL (ref 0.0–0.3)
BILIRUBIN TOTAL: 0.8 mg/dL (ref 0.2–1.2)
Total Protein: 6.8 g/dL (ref 6.0–8.3)

## 2017-07-07 LAB — LIPID PANEL
CHOL/HDL RATIO: 5
Cholesterol: 192 mg/dL (ref 0–200)
HDL: 36.2 mg/dL — ABNORMAL LOW (ref >39.00)
NonHDL: 156.22
Triglycerides: 286 mg/dL — ABNORMAL HIGH (ref 0.0–149.0)
VLDL: 57.2 mg/dL — AB (ref 0.0–40.0)

## 2017-07-07 LAB — LDL CHOLESTEROL, DIRECT: Direct LDL: 119 mg/dL

## 2017-07-08 ENCOUNTER — Encounter: Payer: Self-pay | Admitting: Emergency Medicine

## 2017-10-14 ENCOUNTER — Ambulatory Visit: Payer: BLUE CROSS/BLUE SHIELD | Admitting: Physician Assistant

## 2017-10-14 ENCOUNTER — Encounter: Payer: Self-pay | Admitting: Physician Assistant

## 2017-10-14 ENCOUNTER — Other Ambulatory Visit: Payer: Self-pay

## 2017-10-14 VITALS — BP 104/64 | HR 68 | Temp 97.9°F | Resp 15 | Ht 73.75 in | Wt 241.2 lb

## 2017-10-14 DIAGNOSIS — E785 Hyperlipidemia, unspecified: Secondary | ICD-10-CM

## 2017-10-14 LAB — LIPID PANEL
CHOL/HDL RATIO: 5
CHOLESTEROL: 164 mg/dL (ref 0–200)
HDL: 35.8 mg/dL — AB (ref >39.00)
LDL Cholesterol: 92 mg/dL (ref 0–99)
NonHDL: 128.18
TRIGLYCERIDES: 182 mg/dL — AB (ref 0.0–149.0)
VLDL: 36.4 mg/dL (ref 0.0–40.0)

## 2017-10-14 NOTE — Patient Instructions (Signed)
Keep working on increasing aerobic exercise to goal of 150 minutes. Great job with dietary changes. Keep this up! Try to drink more water.   Please go to the lab today for blood work.  I will call you with your results. We will alter treatment regimen(s) if indicated by your results.    Cholesterol Cholesterol is a fat. Your body needs a small amount of cholesterol. Cholesterol (plaque) may build up in your blood vessels (arteries). That makes you more likely to have a heart attack or stroke. You cannot feel your cholesterol level. Having a blood test is the only way to find out if your level is high. Keep your test results. Work with your doctor to keep your cholesterol at a good level. What do the results mean?  Total cholesterol is how much cholesterol is in your blood.  LDL is bad cholesterol. This is the type that can build up. Try to have low LDL.  HDL is good cholesterol. It cleans your blood vessels and carries LDL away. Try to have high HDL.  Triglycerides are fat that the body can store or burn for energy. What are good levels of cholesterol?  Total cholesterol below 200.  LDL below 100 is good for people who have health risks. LDL below 70 is good for people who have very high risks.  HDL above 40 is good. It is best to have HDL of 60 or higher.  Triglycerides below 150. How can I lower my cholesterol? Diet Follow your diet program as told by your doctor.  Choose fish, white meat chicken, or [place] that is roasted or baked. Try not to eat red meat, fried foods, sausage, or lunch meats.  Eat lots of fresh fruits and vegetables.  Choose whole grains, beans, pasta, potatoes, and cereals.  Choose olive oil, corn oil, or canola oil. Only use small amounts.  Try not to eat butter, mayonnaise, shortening, or palm kernel oils.  Try not to eat foods with trans fats.  Choose low-fat or nonfat dairy foods. ? Drink skim or nonfat milk. ? Eat low-fat or nonfat yogurt and  cheeses. ? Try not to drink whole milk or cream. ? Try not to eat ice cream, egg yolks, or full-fat cheeses.  Healthy desserts include angel food cake, ginger snaps, animal crackers, hard candy, popsicles, and low-fat or nonfat frozen yogurt. Try not to eat pastries, cakes, pies, and cookies.  Exercise Follow your exercise program as told by your doctor.  Be more active. Try gardening, walking, and taking the stairs.  Ask your doctor about ways that you can be more active.  Medicine  Take over-the-counter and prescription medicines only as told by your doctor. This information is not intended to replace advice given to you by your health care provider. Make sure you discuss any questions you have with your health care provider. Document Released: 08/09/2008 Document Revised: 12/13/2015 Document Reviewed: 11/23/2015 Elsevier Interactive Patient Education  [employer].

## 2017-10-14 NOTE — Assessment & Plan Note (Signed)
Has made significant dietary changes.  Needs to work harder on exercise. Recommendations given.  Continue atorvastatin 40 mg daily.  Repeat fasting lipids today.

## 2017-10-14 NOTE — Progress Notes (Signed)
Patient presents to clinic today for follow-up of hyperlipidemia. Has cut out fast food and is watching portion sizes. Is exercising some but still not on a regular basis. Notes weight is about the same so he needs to work harder on exercise. Is taking his atorvastatin 40 mg daily as directed. Is still tolerating well.  Lab Results  Component Value Date   CHOL 192 07/07/2017   HDL 36.20 (L) 07/07/2017   LDLCALC 143 (H) 05/29/2017   LDLDIRECT 119.0 07/07/2017   TRIG 286.0 (H) 07/07/2017   CHOLHDL 5 07/07/2017    Past Medical History:  Diagnosis Date  . Anxiety   . Cluster headaches   . Hyperlipidemia     Current Outpatient Medications on File Prior to Visit  Medication Sig Dispense Refill  . atorvastatin (LIPITOR) 40 MG tablet Take 1 tablet (40 mg total) by mouth daily. 90 tablet 0  . Omega-3 Fatty Acids (FISH OIL) 1200 MG CAPS Take 1 capsule by mouth daily. Takes 1400 mg daily    . sertraline (ZOLOFT) 50 MG tablet TAKE 1 TABLET(50 MG) BY MOUTH DAILY 90 tablet 0  . LORazepam (ATIVAN) 1 MG tablet Take 1 tablet (1 mg total) by mouth every 8 (eight) hours. (Patient not taking: Reported on 10/14/2017) 30 tablet 0  . SUMAtriptan (IMITREX) 50 MG tablet Take 1 tablet (50 mg total) by mouth once for 1 dose. May repeat in 2 hours if headache persists or recurs. 10 tablet 0   No current facility-administered medications on file prior to visit.     No Known Allergies  Family History  Problem Relation Age of Onset  . Diabetes Sister        x 2  . Stroke Paternal Grandfather        4s.    Social History   Socioeconomic History  . Marital status: Married    Spouse name: Not on file  . Number of children: Not on file  . Years of education: Not on file  . Highest education level: Not on file  Occupational History  . Not on file  Social Needs  . Financial resource strain: Not on file  . Food insecurity:    Worry: Not on file    Inability: Not on file  . Transportation needs:      Medical: Not on file    Non-medical: Not on file  Tobacco Use  . Smoking status: Never Smoker  . Smokeless tobacco: Never Used  Substance and Sexual Activity  . Alcohol use: Yes    Comment: rare   . Drug use: No  . Sexual activity: Yes  Lifestyle  . Physical activity:    Days per week: Not on file    Minutes per session: Not on file  . Stress: Not on file  Relationships  . Social connections:    Talks on phone: Not on file    Gets together: Not on file    Attends religious service: Not on file    Active member of club or organization: Not on file    Attends meetings of clubs or organizations: Not on file    Relationship status: Not on file  Other Topics Concern  . Not on file  Social History Narrative   2 daughters -- healthy.    Review of Systems - See HPI.  All other ROS are negative.  BP 104/64   Pulse 68   Temp 97.9 F (36.6 C) (Oral)   Resp 15   Ht  6' 1.75" (1.873 m)   Wt 241 lb 3.2 oz (109.4 kg)   SpO2 97%   BMI 31.18 kg/m   Physical Exam  Constitutional: He appears well-developed and well-nourished.  HENT:  Head: Normocephalic and atraumatic.  Eyes: Conjunctivae are normal.  Cardiovascular: Normal rate, regular rhythm and normal heart sounds.  Pulmonary/Chest: Effort normal.  Psychiatric: He has a normal mood and affect.  Vitals reviewed.  Assessment/Plan: Hyperlipidemia Has made significant dietary changes.  Needs to work harder on exercise. Recommendations given.  Continue atorvastatin 40 mg daily.  Repeat fasting lipids today.     Piedad Climes, PA-C

## 2017-10-17 ENCOUNTER — Encounter: Payer: Self-pay | Admitting: Physician Assistant

## 2017-10-17 ENCOUNTER — Other Ambulatory Visit: Payer: Self-pay | Admitting: Emergency Medicine

## 2017-10-17 DIAGNOSIS — F411 Generalized anxiety disorder: Secondary | ICD-10-CM

## 2017-10-17 DIAGNOSIS — E785 Hyperlipidemia, unspecified: Secondary | ICD-10-CM

## 2017-10-17 MED ORDER — ATORVASTATIN CALCIUM 40 MG PO TABS: 40.0000 mg | ORAL_TABLET | Freq: Every day | ORAL | 1 refills | Status: DC

## 2017-10-17 MED ORDER — SERTRALINE HCL 50 MG PO TABS: ORAL_TABLET | ORAL | 1 refills | Status: DC

## 2018-01-28 ENCOUNTER — Encounter: Payer: Self-pay | Admitting: Physician Assistant

## 2018-01-29 ENCOUNTER — Other Ambulatory Visit: Payer: Self-pay

## 2018-01-29 DIAGNOSIS — E785 Hyperlipidemia, unspecified: Secondary | ICD-10-CM

## 2018-01-29 DIAGNOSIS — F411 Generalized anxiety disorder: Secondary | ICD-10-CM

## 2018-01-29 MED ORDER — SERTRALINE HCL 50 MG PO TABS: ORAL_TABLET | ORAL | 1 refills | Status: DC

## 2018-01-29 MED ORDER — ATORVASTATIN CALCIUM 40 MG PO TABS
40.0000 mg | ORAL_TABLET | Freq: Every day | ORAL | 1 refills | Status: DC
Start: 2018-01-29 — End: 2018-07-29

## 2018-03-11 DIAGNOSIS — Z23 Encounter for immunization: Secondary | ICD-10-CM | POA: Diagnosis not present

## 2018-04-06 DIAGNOSIS — D2261 Melanocytic nevi of right upper limb, including shoulder: Secondary | ICD-10-CM | POA: Diagnosis not present

## 2018-04-06 DIAGNOSIS — D225 Melanocytic nevi of trunk: Secondary | ICD-10-CM | POA: Diagnosis not present

## 2018-04-06 DIAGNOSIS — D2262 Melanocytic nevi of left upper limb, including shoulder: Secondary | ICD-10-CM | POA: Diagnosis not present

## 2018-04-06 DIAGNOSIS — L821 Other seborrheic keratosis: Secondary | ICD-10-CM | POA: Diagnosis not present

## 2018-07-24 ENCOUNTER — Ambulatory Visit: Payer: BLUE CROSS/BLUE SHIELD | Admitting: Physician Assistant

## 2018-07-29 ENCOUNTER — Other Ambulatory Visit: Payer: Self-pay

## 2018-07-29 ENCOUNTER — Encounter: Payer: Self-pay | Admitting: Physician Assistant

## 2018-07-29 ENCOUNTER — Ambulatory Visit: Payer: BLUE CROSS/BLUE SHIELD | Admitting: Physician Assistant

## 2018-07-29 DIAGNOSIS — F411 Generalized anxiety disorder: Secondary | ICD-10-CM | POA: Diagnosis not present

## 2018-07-29 DIAGNOSIS — E785 Hyperlipidemia, unspecified: Secondary | ICD-10-CM

## 2018-07-29 LAB — COMPREHENSIVE METABOLIC PANEL
ALBUMIN: 4.7 g/dL (ref 3.5–5.2)
ALT: 55 U/L — ABNORMAL HIGH (ref 0–53)
AST: 42 U/L — ABNORMAL HIGH (ref 0–37)
Alkaline Phosphatase: 93 U/L (ref 39–117)
BUN: 27 mg/dL — ABNORMAL HIGH (ref 6–23)
CO2: 28 mEq/L (ref 19–32)
Calcium: 9.4 mg/dL (ref 8.4–10.5)
Chloride: 102 mEq/L (ref 96–112)
Creatinine, Ser: 1.01 mg/dL (ref 0.40–1.50)
GFR: 81.23 mL/min (ref >60.00)
Glucose, Bld: 82 mg/dL (ref 70–99)
Potassium: 4.2 mEq/L (ref 3.5–5.1)
SODIUM: 138 meq/L (ref 135–145)
Total Bilirubin: 1.2 mg/dL (ref 0.2–1.2)
Total Protein: 7.3 g/dL (ref 6.0–8.3)

## 2018-07-29 LAB — LIPID PANEL
Cholesterol: 172 mg/dL (ref 0–200)
HDL: 39.4 mg/dL (ref >39.00)
LDL Cholesterol: 101 mg/dL — ABNORMAL HIGH (ref 0–99)
NonHDL: 132.32
Total CHOL/HDL Ratio: 4
Triglycerides: 158 mg/dL — ABNORMAL HIGH (ref 0.0–149.0)
VLDL: 31.6 mg/dL (ref 0.0–40.0)

## 2018-07-29 MED ORDER — SERTRALINE HCL 50 MG PO TABS: ORAL_TABLET | ORAL | 1 refills | Status: DC

## 2018-07-29 MED ORDER — ATORVASTATIN CALCIUM 40 MG PO TABS: 40.0000 mg | ORAL_TABLET | Freq: Every day | ORAL | 1 refills | Status: DC

## 2018-07-29 NOTE — Patient Instructions (Signed)
Please go to the lab today for blood work.  I will call you with your results. We will alter treatment regimen(s) if indicated by your results.   Please keep up with aerobic activity.  Work on diet low in saturated fats and cholesterol.   Fat and Cholesterol Restricted Eating Plan Getting too much fat and cholesterol in your diet may cause health problems. Choosing the right foods helps keep your fat and cholesterol at normal levels. This can keep you from getting certain diseases. Your doctor may recommend an eating plan that includes:  Total fat: ______% or less of total calories a day.  Saturated fat: ______% or less of total calories a day.  Cholesterol: less than _________mg a day.  Fiber: ______g a day. What are tips for following this plan? Meal planning  At meals, divide your plate into four equal parts: ? Fill one-half of your plate with vegetables and green salads. ? Fill one-fourth of your plate with whole grains. ? Fill one-fourth of your plate with low-fat (lean) protein foods.  Eat fish that is high in omega-3 fats at least two times a week. This includes mackerel, tuna, sardines, and salmon.  Eat foods that are high in fiber, such as whole grains, beans, apples, broccoli, carrots, peas, and barley. General tips   Work with your doctor to lose weight if you need to.  Avoid: ? Foods with added sugar. ? Fried foods. ? Foods with partially hydrogenated oils.  Limit alcohol intake to no more than 1 drink a day for nonpregnant women and 2 drinks a day for men. One drink equals 12 oz of beer, 5 oz of wine, or 1 oz of hard liquor. Reading food labels  Check food labels for: ? Trans fats. ? Partially hydrogenated oils. ? Saturated fat (g) in each serving. ? Cholesterol (mg) in each serving. ? Fiber (g) in each serving.  Choose foods with healthy fats, such as: ? Monounsaturated fats. ? Polyunsaturated fats. ? Omega-3 fats.  Choose grain products that have  whole grains. Look for the word "whole" as the first word in the ingredient list. Cooking  Cook foods using low-fat methods. These include baking, boiling, grilling, and broiling.  Eat more home-cooked foods. Eat at restaurants and buffets less often.  Avoid cooking using saturated fats, such as butter, cream, palm oil, palm kernel oil, and coconut oil. Recommended foods  Fruits  All fresh, canned (in natural juice), or frozen fruits. Vegetables  Fresh or frozen vegetables (raw, steamed, roasted, or grilled). Green salads. Grains  Whole grains, such as whole wheat or whole grain breads, crackers, cereals, and pasta. Unsweetened oatmeal, bulgur, barley, quinoa, or brown rice. Corn or whole wheat flour tortillas. Meats and other protein foods  Ground beef (85% or leaner), grass-fed beef, or beef trimmed of fat. Skinless chicken or [place]. Ground chicken or [place]. Pork trimmed of fat. All fish and seafood. Egg whites. Dried beans, peas, or lentils. Unsalted nuts or seeds. Unsalted canned beans. Nut butters without added sugar or oil. Dairy  Low-fat or nonfat dairy products, such as skim or 1% milk, 2% or reduced-fat cheeses, low-fat and fat-free ricotta or cottage cheese, or plain low-fat and nonfat yogurt. Fats and oils  Tub margarine without trans fats. Light or reduced-fat mayonnaise and salad dressings. Avocado. Olive, canola, sesame, or safflower oils. The items listed above may not be a complete list of foods and beverages you can eat. Contact a dietitian for more information. Foods to avoid Fruits  Canned fruit in heavy syrup. Fruit in cream or butter sauce. Fried fruit. Vegetables  Vegetables cooked in cheese, cream, or butter sauce. Fried vegetables. Grains  White bread. White pasta. White rice. Cornbread. Bagels, pastries, and croissants. Crackers and snack foods that contain trans fat and hydrogenated oils. Meats and other protein foods  Fatty cuts of meat. Ribs,  chicken wings, bacon, sausage, bologna, salami, chitterlings, fatback, hot dogs, bratwurst, and packaged lunch meats. Liver and organ meats. Whole eggs and egg yolks. Chicken and [place] with skin. Fried meat. Dairy  Whole or 2% milk, cream, half-and-half, and cream cheese. Whole milk cheeses. Whole-fat or sweetened yogurt. Full-fat cheeses. Nondairy creamers and whipped toppings. Processed cheese, cheese spreads, and cheese curds. Beverages  Alcohol. Sugar-sweetened drinks such as sodas, lemonade, and fruit drinks. Fats and oils  Butter, stick margarine, lard, shortening, ghee, or bacon fat. Coconut, palm kernel, and palm oils. Sweets and desserts  Corn syrup, sugars, honey, and molasses. Candy. Jam and jelly. Syrup. Sweetened cereals. Cookies, pies, cakes, donuts, muffins, and ice cream. The items listed above may not be a complete list of foods and beverages you should avoid. Contact a dietitian for more information. Summary  Choosing the right foods helps keep your fat and cholesterol at normal levels. This can keep you from getting certain diseases.  At meals, fill one-half of your plate with vegetables and green salads.  Eat high-fiber foods, like whole grains, beans, apples, carrots, peas, and barley.  Limit added sugar, saturated fats, alcohol, and fried foods. This information is not intended to replace advice given to you by your health care provider. Make sure you discuss any questions you have with your health care provider. Document Released: 11/12/2011 Document Revised: 01/14/2018 Document Reviewed: 01/28/2017 Elsevier Interactive Patient Education  2019 [employer].

## 2018-07-29 NOTE — Progress Notes (Signed)
Patient presents to clinic today for follow-up of hyperlipidemia and GAD.  Hyperlipidemia -- Is currently on a regimen of Atorvastatin 40 mg and fish oils daily. Body mass index is 31.26 kg/m. Is trying to keep a well-balanced diet. Is playing hockey several times per week to keep active.  GAD -- Currently on a regimen of Sertraline 50 mg daily. He has done very well on this regimen for some times and still endorses good control. Denies breakthrough symptoms with regimen. Notes 8 hours of sleep per night.   Past Medical History:  Diagnosis Date  . Anxiety   . Cluster headaches   . Hyperlipidemia     Current Outpatient Medications on File Prior to Visit  Medication Sig Dispense Refill  . LORazepam (ATIVAN) 1 MG tablet Take 1 tablet (1 mg total) by mouth every 8 (eight) hours. 30 tablet 0  . Omega-3 Fatty Acids (FISH OIL) 1200 MG CAPS Take 1 capsule by mouth daily. Takes 1400 mg daily    . SUMAtriptan (IMITREX) 50 MG tablet Take 1 tablet (50 mg total) by mouth once for 1 dose. May repeat in 2 hours if headache persists or recurs. 10 tablet 0   No current facility-administered medications on file prior to visit.     No Known Allergies  Family History  Problem Relation Age of Onset  . Diabetes Sister        x 2  . Stroke Paternal Grandfather        68s.    Social History   Socioeconomic History  . Marital status: Married    Spouse name: Not on file  . Number of children: Not on file  . Years of education: Not on file  . Highest education level: Not on file  Occupational History  . Not on file  Social Needs  . Financial resource strain: Not on file  . Food insecurity:    Worry: Not on file    Inability: Not on file  . Transportation needs:    Medical: Not on file    Non-medical: Not on file  Tobacco Use  . Smoking status: Never Smoker  . Smokeless tobacco: Never Used  Substance and Sexual Activity  . Alcohol use: Yes    Comment: rare   . Drug use: No  . Sexual  activity: Yes  Lifestyle  . Physical activity:    Days per week: Not on file    Minutes per session: Not on file  . Stress: Not on file  Relationships  . Social connections:    Talks on phone: Not on file    Gets together: Not on file    Attends religious service: Not on file    Active member of club or organization: Not on file    Attends meetings of clubs or organizations: Not on file    Relationship status: Not on file  Other Topics Concern  . Not on file  Social History Narrative   2 daughters -- healthy.    Review of Systems - See HPI.  All other ROS are negative.  BP 118/74   Pulse 70   Temp (!) 97.5 F (36.4 C) (Oral)   Resp 16   Ht 6' 1.48" (1.866 m)   Wt 240 lb (108.9 kg)   SpO2 98%   BMI 31.26 kg/m   Physical Exam Vitals signs reviewed.  Constitutional:      Appearance: Normal appearance.  HENT:     Head: Normocephalic and atraumatic.  Right Ear: Tympanic membrane normal.     Left Ear: Tympanic membrane normal.     Mouth/Throat:     Mouth: Mucous membranes are moist.  Eyes:     Conjunctiva/sclera: Conjunctivae normal.     Pupils: Pupils are equal, round, and reactive to light.  Neck:     Musculoskeletal: Neck supple.  Cardiovascular:     Rate and Rhythm: Normal rate and regular rhythm.     Pulses: Normal pulses.     Heart sounds: Normal heart sounds.  Pulmonary:     Effort: Pulmonary effort is normal.     Breath sounds: Normal breath sounds.  Neurological:     General: No focal deficit present.     Mental Status: He is alert and oriented to person, place, and time.  Psychiatric:        Mood and Affect: Mood normal.    Assessment/Plan: Generalized anxiety disorder Doing very well. Continue current regimen.  Hyperlipidemia Dietary and exercise regimen reviewed. Continue current regimen. Repeat fasting labs today.     Piedad Climes, PA-C

## 2018-07-29 NOTE — Assessment & Plan Note (Signed)
Doing very well. Continue current regimen.  

## 2018-07-29 NOTE — Assessment & Plan Note (Signed)
Dietary and exercise regimen reviewed. Continue current regimen. Repeat fasting labs today.

## 2018-07-30 ENCOUNTER — Other Ambulatory Visit: Payer: Self-pay | Admitting: *Deleted

## 2018-07-30 ENCOUNTER — Encounter: Payer: Self-pay | Admitting: *Deleted

## 2018-07-30 DIAGNOSIS — R7989 Other specified abnormal findings of blood chemistry: Secondary | ICD-10-CM

## 2018-07-30 DIAGNOSIS — R945 Abnormal results of liver function studies: Principal | ICD-10-CM

## 2018-08-07 ENCOUNTER — Other Ambulatory Visit: Payer: Self-pay

## 2018-08-07 ENCOUNTER — Other Ambulatory Visit (INDEPENDENT_AMBULATORY_CARE_PROVIDER_SITE_OTHER): Payer: BLUE CROSS/BLUE SHIELD

## 2018-08-07 DIAGNOSIS — R7989 Other specified abnormal findings of blood chemistry: Secondary | ICD-10-CM

## 2018-08-07 DIAGNOSIS — R945 Abnormal results of liver function studies: Secondary | ICD-10-CM | POA: Diagnosis not present

## 2018-08-07 LAB — HEPATIC FUNCTION PANEL
ALT: 22 U/L (ref 0–53)
AST: 18 U/L (ref 0–37)
Albumin: 4.7 g/dL (ref 3.5–5.2)
Alkaline Phosphatase: 84 U/L (ref 39–117)
Bilirubin, Direct: 0.1 mg/dL (ref 0.0–0.3)
Total Bilirubin: 0.8 mg/dL (ref 0.2–1.2)
Total Protein: 7.5 g/dL (ref 6.0–8.3)

## 2018-11-10 ENCOUNTER — Other Ambulatory Visit: Payer: Self-pay | Admitting: Physician Assistant

## 2018-11-10 DIAGNOSIS — F411 Generalized anxiety disorder: Secondary | ICD-10-CM

## 2018-11-10 DIAGNOSIS — Z03818 Encounter for observation for suspected exposure to other biological agents ruled out: Secondary | ICD-10-CM | POA: Diagnosis not present

## 2018-11-10 DIAGNOSIS — E785 Hyperlipidemia, unspecified: Secondary | ICD-10-CM

## 2018-11-30 ENCOUNTER — Other Ambulatory Visit: Payer: Self-pay | Admitting: Physician Assistant

## 2018-11-30 DIAGNOSIS — F411 Generalized anxiety disorder: Secondary | ICD-10-CM

## 2018-11-30 MED ORDER — LORAZEPAM 1 MG PO TABS: 1.0000 mg | ORAL_TABLET | Freq: Three times a day (TID) | ORAL | 0 refills | Status: DC

## 2018-11-30 NOTE — Telephone Encounter (Signed)
Last refill:04/24/16 #30, 0 Last OV:07/29/18

## 2018-12-09 DIAGNOSIS — F411 Generalized anxiety disorder: Secondary | ICD-10-CM | POA: Diagnosis not present

## 2018-12-09 DIAGNOSIS — L80 Vitiligo: Secondary | ICD-10-CM | POA: Diagnosis not present

## 2018-12-14 DIAGNOSIS — F411 Generalized anxiety disorder: Secondary | ICD-10-CM | POA: Diagnosis not present

## 2019-01-04 DIAGNOSIS — F411 Generalized anxiety disorder: Secondary | ICD-10-CM | POA: Diagnosis not present

## 2019-01-30 ENCOUNTER — Encounter: Payer: Self-pay | Admitting: Physician Assistant

## 2019-01-30 DIAGNOSIS — E785 Hyperlipidemia, unspecified: Secondary | ICD-10-CM

## 2019-01-30 DIAGNOSIS — F411 Generalized anxiety disorder: Secondary | ICD-10-CM

## 2019-01-31 MED ORDER — ATORVASTATIN CALCIUM 40 MG PO TABS: 40.0000 mg | ORAL_TABLET | Freq: Every day | ORAL | 1 refills | Status: DC

## 2019-01-31 MED ORDER — SERTRALINE HCL 50 MG PO TABS: ORAL_TABLET | ORAL | 1 refills | Status: DC

## 2019-02-02 ENCOUNTER — Other Ambulatory Visit: Payer: Self-pay

## 2019-02-02 ENCOUNTER — Ambulatory Visit (INDEPENDENT_AMBULATORY_CARE_PROVIDER_SITE_OTHER): Payer: BC Managed Care – PPO

## 2019-02-02 ENCOUNTER — Encounter: Payer: Self-pay | Admitting: Physician Assistant

## 2019-02-02 DIAGNOSIS — Z23 Encounter for immunization: Secondary | ICD-10-CM

## 2019-02-26 ENCOUNTER — Encounter: Payer: Self-pay | Admitting: Physician Assistant

## 2019-02-26 MED ORDER — SERTRALINE HCL 100 MG PO TABS: 100.0000 mg | ORAL_TABLET | Freq: Every day | ORAL | 0 refills | Status: DC

## 2019-04-13 ENCOUNTER — Ambulatory Visit (INDEPENDENT_AMBULATORY_CARE_PROVIDER_SITE_OTHER): Payer: BC Managed Care – PPO | Admitting: Physician Assistant

## 2019-04-13 ENCOUNTER — Encounter: Payer: Self-pay | Admitting: Physician Assistant

## 2019-04-13 ENCOUNTER — Other Ambulatory Visit: Payer: Self-pay

## 2019-04-13 VITALS — Temp 97.7°F | Wt 233.0 lb

## 2019-04-13 DIAGNOSIS — F411 Generalized anxiety disorder: Secondary | ICD-10-CM

## 2019-04-13 MED ORDER — SERTRALINE HCL 100 MG PO TABS: 100.0000 mg | ORAL_TABLET | Freq: Every day | ORAL | 1 refills | Status: DC

## 2019-04-13 NOTE — Progress Notes (Signed)
   Virtual Visit via Video   I connected with patient on 04/13/19 at  4:00 PM EST by a video enabled telemedicine application and verified that I am speaking with the correct person using two identifiers.  Location patient: Home Location provider: [facility] Office Persons participating in the virtual visit: Patient, Provider, [place] (Patina Moore)  I discussed the limitations of evaluation and management by telemedicine and the availability of in person appointments. The patient expressed understanding and agreed to proceed.  Subjective:   HPI:   Patient presents via [employer] today for follow-up and anxiety after increase in Sertraline to 100 mg QD. Endorses taking as directed and tolerating well without side effect. Denies depressed mood or anhedonia. Denies SI/HI. Notes improvement in anxiety levels. Denies panic attack. Is resting well at night. Overall happy with noted improvement.   ROS:   See pertinent positives and negatives per HPI.  Patient Active Problem List   Diagnosis Date Noted  . Episodic cluster headache, not intractable 05/28/2017  . Visit for preventive health examination 10/24/2016  . BMI 29.0-29.9,adult 10/24/2016  . Generalized anxiety disorder 04/24/2016  . Hyperlipidemia 04/24/2016    Social History   Tobacco Use  . Smoking status: Never Smoker  . Smokeless tobacco: Never Used  Substance Use Topics  . Alcohol use: Yes    Comment: rare     Current Outpatient Medications:  .  atorvastatin (LIPITOR) 40 MG tablet, Take 1 tablet (40 mg total) by mouth daily., Disp: 90 tablet, Rfl: 1 .  LORazepam (ATIVAN) 1 MG tablet, Take 1 tablet (1 mg total) by mouth every 8 (eight) hours., Disp: 30 tablet, Rfl: 0 .  Omega-3 Fatty Acids (FISH OIL) 1200 MG CAPS, Take 1 capsule by mouth daily. Takes 1400 mg daily, Disp: , Rfl:  .  sertraline (ZOLOFT) 100 MG tablet, Take 1 tablet (100 mg total) by mouth daily., Disp: 30 tablet, Rfl: 0 .  SUMAtriptan (IMITREX) 50 MG  tablet, Take 1 tablet (50 mg total) by mouth once for 1 dose. May repeat in 2 hours if headache persists or recurs., Disp: 10 tablet, Rfl: 0  No Known Allergies  Objective:   Temp 97.7 F (36.5 C) (Oral)   Wt 233 lb (105.7 kg)   BMI 30.34 kg/m   Patient is well-developed, well-nourished in no acute distress.  Resting comfortably at home.  Head is normocephalic, atraumatic.  No labored breathing.  Speech is clear and coherent with logical content.  Patient is alert and oriented at baseline.   Assessment and Plan:   1. Generalized anxiety disorder Doing well. Continue current regimen. Medications refilled. Follow-up in January as scheduled for CPE. RTC sooner if needed.   - sertraline (ZOLOFT) 100 MG tablet; Take 1 tablet (100 mg total) by mouth daily.  Dispense: 90 tablet; Refill: 1 .   Leeanne Rio, PA-C 04/13/2019

## 2019-04-13 NOTE — Patient Instructions (Signed)
Instructions sent to MyChart.  I am glad you are doing so well. We will continue the Sertraline at 100 mg dose. I have sent in refills for you. We will follow-up in January as scheduled. Sooner if you need [place] for anything. Take care!

## 2019-04-13 NOTE — Progress Notes (Signed)
I have discussed the procedure for the virtual visit with the patient who has given consent to proceed with assessment and treatment.   Deane Wattenbarger S Kelsay Haggard, CMA     

## 2019-04-27 ENCOUNTER — Other Ambulatory Visit: Payer: Self-pay

## 2019-04-27 DIAGNOSIS — Z20822 Contact with and (suspected) exposure to covid-19: Secondary | ICD-10-CM

## 2019-04-29 LAB — NOVEL CORONAVIRUS, NAA: SARS-CoV-2, NAA: NOT DETECTED

## 2019-06-03 DIAGNOSIS — F411 Generalized anxiety disorder: Secondary | ICD-10-CM | POA: Diagnosis not present

## 2019-06-15 DIAGNOSIS — F411 Generalized anxiety disorder: Secondary | ICD-10-CM | POA: Diagnosis not present

## 2019-06-21 ENCOUNTER — Other Ambulatory Visit: Payer: Self-pay

## 2019-06-22 ENCOUNTER — Encounter: Payer: Self-pay | Admitting: Physician Assistant

## 2019-06-22 ENCOUNTER — Ambulatory Visit (INDEPENDENT_AMBULATORY_CARE_PROVIDER_SITE_OTHER): Payer: BC Managed Care – PPO | Admitting: Physician Assistant

## 2019-06-22 VITALS — BP 110/80 | HR 84 | Temp 97.8°F | Resp 16 | Ht 73.5 in | Wt 232.0 lb

## 2019-06-22 DIAGNOSIS — Z0000 Encounter for general adult medical examination without abnormal findings: Secondary | ICD-10-CM

## 2019-06-22 DIAGNOSIS — H539 Unspecified visual disturbance: Secondary | ICD-10-CM

## 2019-06-22 DIAGNOSIS — E785 Hyperlipidemia, unspecified: Secondary | ICD-10-CM | POA: Diagnosis not present

## 2019-06-22 DIAGNOSIS — F411 Generalized anxiety disorder: Secondary | ICD-10-CM

## 2019-06-22 LAB — CBC WITH DIFFERENTIAL/PLATELET
Basophils Absolute: 0 10*3/uL (ref 0.0–0.1)
Basophils Relative: 0.8 % (ref 0.0–3.0)
Eosinophils Absolute: 0.1 10*3/uL (ref 0.0–0.7)
Eosinophils Relative: 2.6 % (ref 0.0–5.0)
HCT: 42.7 % (ref 39.0–52.0)
Hemoglobin: 14.6 g/dL (ref 13.0–17.0)
Lymphocytes Relative: 40.4 % (ref 12.0–46.0)
Lymphs Abs: 2.1 10*3/uL (ref 0.7–4.0)
MCHC: 34.2 g/dL (ref 30.0–36.0)
MCV: 87.2 fl (ref 78.0–100.0)
Monocytes Absolute: 0.4 10*3/uL (ref 0.1–1.0)
Monocytes Relative: 7.2 % (ref 3.0–12.0)
Neutro Abs: 2.6 10*3/uL (ref 1.4–7.7)
Neutrophils Relative %: 49 % (ref 43.0–77.0)
Platelets: 200 10*3/uL (ref 150.0–400.0)
RBC: 4.9 Mil/uL (ref 4.22–5.81)
RDW: 12.7 % (ref 11.5–15.5)
WBC: 5.3 10*3/uL (ref 4.0–10.5)

## 2019-06-22 LAB — COMPREHENSIVE METABOLIC PANEL
ALT: 22 U/L (ref 0–53)
AST: 17 U/L (ref 0–37)
Albumin: 4.8 g/dL (ref 3.5–5.2)
Alkaline Phosphatase: 77 U/L (ref 39–117)
BUN: 15 mg/dL (ref 6–23)
CO2: 29 mEq/L (ref 19–32)
Calcium: 9.7 mg/dL (ref 8.4–10.5)
Chloride: 103 mEq/L (ref 96–112)
Creatinine, Ser: 0.97 mg/dL (ref 0.40–1.50)
GFR: 84.74 mL/min (ref >60.00)
Glucose, Bld: 91 mg/dL (ref 70–99)
Potassium: 4 mEq/L (ref 3.5–5.1)
Sodium: 138 mEq/L (ref 135–145)
Total Bilirubin: 1.2 mg/dL (ref 0.2–1.2)
Total Protein: 7.1 g/dL (ref 6.0–8.3)

## 2019-06-22 LAB — LIPID PANEL
Cholesterol: 187 mg/dL (ref 0–200)
HDL: 33.6 mg/dL — ABNORMAL LOW (ref >39.00)
LDL Cholesterol: 118 mg/dL — ABNORMAL HIGH (ref 0–99)
NonHDL: 153.71
Total CHOL/HDL Ratio: 6
Triglycerides: 180 mg/dL — ABNORMAL HIGH (ref 0.0–149.0)
VLDL: 36 mg/dL (ref 0.0–40.0)

## 2019-06-22 NOTE — Progress Notes (Signed)
Patient presents to clinic today for annual exam.  Patient is fasting for labs.  Diet --endorses well-balanced diet overall. Exercise --notes he and his wife walk several times per week.  Acute Concerns:   Chronic Issues: GAD --currently on a regimen of sertraline 100 mg daily.  Also has lorazepam 1 mg to use if needed.  Endorses taking sertraline daily as directed.  Tolerating well with good results.  Notes mood is stable overall.  Denies breakthrough symptoms.  Denies difficulty with sleep or change in appetite.  Denies suicidal thought or ideation.  Very rare use of Ativan.  Hyperlipidemia --patient currently on a regimen of atorvastatin 40 mg once daily.  Also takes OTC fish oil.  Endorses taking medications as directed.  Diet and activity level as noted above.  Past Medical History:  Diagnosis Date  . Anxiety   . Cluster headaches   . Hyperlipidemia     Past Surgical History:  Procedure Laterality Date  . NO PAST SURGERIES      Current Outpatient Medications on File Prior to Visit  Medication Sig Dispense Refill  . atorvastatin (LIPITOR) 40 MG tablet Take 1 tablet (40 mg total) by mouth daily. 90 tablet 1  . LORazepam (ATIVAN) 1 MG tablet Take 1 tablet (1 mg total) by mouth every 8 (eight) hours. 30 tablet 0  . Omega-3 Fatty Acids (FISH OIL) 1200 MG CAPS Take 1 capsule by mouth daily. Takes 1400 mg daily    . sertraline (ZOLOFT) 100 MG tablet Take 1 tablet (100 mg total) by mouth daily. 90 tablet 1  . SUMAtriptan (IMITREX) 50 MG tablet Take 1 tablet (50 mg total) by mouth once for 1 dose. May repeat in 2 hours if headache persists or recurs. 10 tablet 0   No current facility-administered medications on file prior to visit.    No Known Allergies  Family History  Problem Relation Age of Onset  . Diabetes Sister        x 2  . Stroke Paternal Grandfather        41s.    Social History   Socioeconomic History  . Marital status: Married    Spouse name: Not on  file  . Number of children: Not on file  . Years of education: Not on file  . Highest education level: Not on file  Occupational History  . Not on file  Tobacco Use  . Smoking status: Never Smoker  . Smokeless tobacco: Never Used  Substance and Sexual Activity  . Alcohol use: Yes    Comment: rare   . Drug use: No  . Sexual activity: Yes  Other Topics Concern  . Not on file  Social History Narrative   2 daughters -- healthy.    Social Determinants of Health   Financial Resource Strain:   . Difficulty of Paying Living Expenses: Not on file  Food Insecurity:   . Worried About Programme researcher, broadcasting/film/video in the Last Year: Not on file  . Ran Out of Food in the Last Year: Not on file  Transportation Needs:   . Lack of Transportation (Medical): Not on file  . Lack of Transportation (Non-Medical): Not on file  Physical Activity:   . Days of Exercise per Week: Not on file  . Minutes of Exercise per Session: Not on file  Stress:   . Feeling of Stress : Not on file  Social Connections:   . Frequency of Communication with Friends and Family: Not on file  .  Frequency of Social Gatherings with Friends and Family: Not on file  . Attends Religious Services: Not on file  . Active Member of Clubs or Organizations: Not on file  . Attends Banker Meetings: Not on file  . Marital Status: Not on file  Intimate Partner Violence:   . Fear of Current or Ex-Partner: Not on file  . Emotionally Abused: Not on file  . Physically Abused: Not on file  . Sexually Abused: Not on file   Review of Systems  Constitutional: Negative for fever and weight loss.  HENT: Negative for ear discharge, ear pain, hearing loss and tinnitus.   Eyes: Positive for blurred vision. Negative for double vision, photophobia and pain.  Respiratory: Negative for cough and shortness of breath.   Cardiovascular: Negative for chest pain and palpitations.  Gastrointestinal: Negative for abdominal pain, blood in stool,  constipation, diarrhea, heartburn, melena, nausea and vomiting.  Genitourinary: Negative for dysuria, flank pain, frequency, hematuria and urgency.  Musculoskeletal: Negative for falls.  Neurological: Negative for dizziness, loss of consciousness and headaches.  Endo/Heme/Allergies: Negative for environmental allergies.  Psychiatric/Behavioral: Negative for depression, hallucinations, substance abuse and suicidal ideas. The patient is not nervous/anxious and does not have insomnia.    BP 110/80   Pulse 84   Temp 97.8 F (36.6 C) (Temporal)   Resp 16   Ht 6' 1.5" (1.867 m)   Wt 232 lb (105.2 kg)   SpO2 98%   BMI 30.19 kg/m   Physical Exam Vitals reviewed.  Constitutional:      General: He is not in acute distress.    Appearance: He is well-developed. He is not diaphoretic.  HENT:     Head: Normocephalic and atraumatic.     Right Ear: Tympanic membrane, ear canal and external ear normal.     Left Ear: Tympanic membrane, ear canal and external ear normal.     Nose: Nose normal.     Mouth/Throat:     Pharynx: No posterior oropharyngeal erythema.  Eyes:     Conjunctiva/sclera: Conjunctivae normal.     Pupils: Pupils are equal, round, and reactive to light.  Neck:     Thyroid: No thyromegaly.  Cardiovascular:     Rate and Rhythm: Normal rate and regular rhythm.     Heart sounds: Normal heart sounds.  Pulmonary:     Effort: Pulmonary effort is normal. No respiratory distress.     Breath sounds: Normal breath sounds. No wheezing or rales.  Chest:     Chest wall: No tenderness.  Abdominal:     General: Bowel sounds are normal. There is no distension.     Palpations: Abdomen is soft. There is no mass.     Tenderness: There is no abdominal tenderness. There is no guarding or rebound.  Musculoskeletal:     Cervical back: Neck supple.  Lymphadenopathy:     Cervical: No cervical adenopathy.  Skin:    General: Skin is warm and dry.     Findings: No rash.  Neurological:      Mental Status: He is alert and oriented to person, place, and time.     Cranial Nerves: No cranial nerve deficit.    Recent Results (from the past 2160 hour(s))  Novel Coronavirus, NAA ([facility])     Status: None   Collection Time: 04/27/19 10:45 AM   Specimen: Nasopharyngeal(NP) swabs in vial transport medium   NASOPHARYNGE  TESTING  Result Value Ref Range   SARS-CoV-2, NAA Not Detected Not Detected  Comment: This nucleic acid amplification test was developed and its performance characteristics determined by [employer]. Nucleic acid amplification tests include PCR and TMA. This test has not been FDA cleared or approved. This test has been authorized by FDA under an Emergency Use Authorization (EUA). This test is only authorized for the duration of time the declaration that circumstances exist justifying the authorization of the emergency use of in vitro diagnostic tests for detection of SARS-CoV-2 virus and/or diagnosis of COVID-19 infection under section 564(b)(1) of the Act, 21 U.S.C. 629BMW-4(X) (1), unless the authorization is terminated or revoked sooner. When diagnostic testing is negative, the possibility of a false negative result should be considered in the context of a patient's recent exposures and the presence of clinical signs and symptoms consistent with COVID-19. An individual without symptoms of COVID-19 and who is not shedding SARS-CoV-2 virus would  expect to have a negative (not detected) result in this assay.     Assessment/Plan: 1. Visit for preventive health examination Depression screen negative. Health Maintenance reviewed. Preventive schedule discussed and handout given in AVS. Will obtain fasting labs today.  - CBC with Differential/Platelet - Comprehensive metabolic panel - Lipid panel  2. Hyperlipidemia, unspecified hyperlipidemia type Taking statin as directed. Continue current regimen. Recheck labs today. - Comprehensive metabolic  panel - Lipid panel  3. Generalized anxiety disorder Stable overall. Continue current medication regimen.    4. Vision changes Notes some issue with tracking. 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 Exam today unremarkable. Has appointment scheduled with opthalmology for dilated eye examination.    This visit occurred during the SARS-CoV-2 public health emergency.  Safety protocols were in place, including screening questions prior to the visit, additional usage of staff PPE, and extensive cleaning of exam room while observing appropriate contact time as indicated for disinfecting solutions.     Leeanne Rio, PA-C

## 2019-06-22 NOTE — Patient Instructions (Signed)
Please go to the lab for blood work.   Our office will call you with your results unless you have chosen to receive results via MyChart.  If your blood work is normal we will follow-up each year for physicals and as scheduled for chronic medical problems.  If anything is abnormal we will treat accordingly and get you in for a follow-up.   Preventive Care 43-43 Years Old, Male Preventive care refers to lifestyle choices and visits with your health care provider that can promote health and wellness. This includes:  A yearly physical exam. This is also called an annual well check.  Regular dental and eye exams.  Immunizations.  Screening for certain conditions.  Healthy lifestyle choices, such as eating a healthy diet, getting regular exercise, not using drugs or products that contain nicotine and tobacco, and limiting alcohol use. What can I expect for my preventive care visit? Physical exam Your health care provider will check:  Height and weight. These may be used to calculate body mass index (BMI), which is a measurement that tells if you are at a healthy weight.  Heart rate and blood pressure.  Your skin for abnormal spots. Counseling Your health care provider may ask you questions about:  Alcohol, tobacco, and drug use.  Emotional well-being.  Home and relationship well-being.  Sexual activity.  Eating habits.  Work and work environment. What immunizations do I need?  Influenza (flu) vaccine  This is recommended every year. Tetanus, diphtheria, and pertussis (Tdap) vaccine  You may need a Td booster every 10 years. Varicella (chickenpox) vaccine  You may need this vaccine if you have not already been vaccinated. Zoster (shingles) vaccine  You may need this after age 60. Measles, mumps, and rubella (MMR) vaccine  You may need at least one dose of MMR if you were born in 1957 or later. You may also need a second dose. Pneumococcal conjugate (PCV13)  vaccine  You may need this if you have certain conditions and were not previously vaccinated. Pneumococcal polysaccharide (PPSV23) vaccine  You may need one or two doses if you smoke cigarettes or if you have certain conditions. Meningococcal conjugate (MenACWY) vaccine  You may need this if you have certain conditions. Hepatitis A vaccine  You may need this if you have certain conditions or if you travel or work in places where you may be exposed to hepatitis A. Hepatitis B vaccine  You may need this if you have certain conditions or if you travel or work in places where you may be exposed to hepatitis B. Haemophilus influenzae type b (Hib) vaccine  You may need this if you have certain risk factors. Human papillomavirus (HPV) vaccine  If recommended by your health care provider, you may need three doses over 6 months. You may receive vaccines as individual doses or as more than one vaccine together in one shot (combination vaccines). Talk with your health care provider about the risks and benefits of combination vaccines. What tests do I need? Blood tests  Lipid and cholesterol levels. These may be checked every 5 years, or more frequently if you are over 50 years old.  Hepatitis C test.  Hepatitis B test. Screening  Lung cancer screening. You may have this screening every year starting at age 55 if you have a 30-pack-year history of smoking and currently smoke or have quit within the past 15 years.  Prostate cancer screening. Recommendations will vary depending on your family history and other risks.  Colorectal cancer   screening. All adults should have this screening starting at age 50 and continuing until age 75. Your health care provider may recommend screening at age 45 if you are at increased risk. You will have tests every 1-10 years, depending on your results and the type of screening test.  Diabetes screening. This is done by checking your blood sugar (glucose) after  you have not eaten for a while (fasting). You may have this done every 1-3 years.  Sexually transmitted disease (STD) testing. Follow these instructions at home: Eating and drinking  Eat a diet that includes fresh fruits and vegetables, whole grains, lean protein, and low-fat dairy products.  Take vitamin and mineral supplements as recommended by your health care provider.  Do not drink alcohol if your health care provider tells you not to drink.  If you drink alcohol: ? Limit how much you have to 0-2 drinks a day. ? Be aware of how much alcohol is in your drink. In the [place] one drink equals one 12 oz bottle of beer (355 mL), one 5 oz glass of wine (148 mL), or one 1 oz glass of hard liquor (44 mL). Lifestyle  Take daily care of your teeth and gums.  Stay active. Exercise for at least 30 minutes on 5 or more days each week.  Do not use any products that contain nicotine or tobacco, such as cigarettes, e-cigarettes, and chewing tobacco. If you need help quitting, ask your health care provider.  If you are sexually active, practice safe sex. Use a condom or other form of protection to prevent STIs (sexually transmitted infections).  Talk with your health care provider about taking a low-dose aspirin every day starting at age 50. What's next?  Go to your health care provider once a year for a well check visit.  Ask your health care provider how often you should have your eyes and teeth checked.  Stay up to date on all vaccines. This information is not intended to replace advice given to you by your health care provider. Make sure you discuss any questions you have with your health care provider. Document Revised: 05/07/2018 Document Reviewed: 05/07/2018 Elsevier Patient Education  2020 Elsevier Inc[employer] .      

## 2019-06-23 ENCOUNTER — Encounter: Payer: Self-pay | Admitting: Emergency Medicine

## 2019-06-23 ENCOUNTER — Other Ambulatory Visit: Payer: Self-pay | Admitting: Emergency Medicine

## 2019-06-23 DIAGNOSIS — E785 Hyperlipidemia, unspecified: Secondary | ICD-10-CM

## 2019-06-26 DIAGNOSIS — Z20828 Contact with and (suspected) exposure to other viral communicable diseases: Secondary | ICD-10-CM | POA: Diagnosis not present

## 2019-06-26 DIAGNOSIS — Z03818 Encounter for observation for suspected exposure to other biological agents ruled out: Secondary | ICD-10-CM | POA: Diagnosis not present

## 2019-07-05 ENCOUNTER — Encounter: Payer: Self-pay | Admitting: Physician Assistant

## 2019-07-05 DIAGNOSIS — R42 Dizziness and giddiness: Secondary | ICD-10-CM

## 2019-07-22 ENCOUNTER — Encounter (INDEPENDENT_AMBULATORY_CARE_PROVIDER_SITE_OTHER): Payer: Self-pay | Admitting: Otolaryngology

## 2019-07-22 ENCOUNTER — Ambulatory Visit (INDEPENDENT_AMBULATORY_CARE_PROVIDER_SITE_OTHER): Payer: BC Managed Care – PPO | Admitting: Otolaryngology

## 2019-07-22 ENCOUNTER — Other Ambulatory Visit: Payer: Self-pay

## 2019-07-22 VITALS — Temp 97.7°F

## 2019-07-22 DIAGNOSIS — R42 Dizziness and giddiness: Secondary | ICD-10-CM

## 2019-07-22 NOTE — Progress Notes (Signed)
HPI: Danny Washington is a 44 y.o. male who presents is referred by his PCP for evaluation of chronic dizziness he has had for the past 2 months.  It comes and goes.  He does not really describe true vertigo or spinning sensation.  More of imbalance and "dizziness".  Some days are worse than other days but it persists for hours.  Symptoms are not typical of inner ear or vestibular abnormality or positional vertigo. Patient does not smoke but used to smoke and quit over 10 years ago. Patient takes Zoloft for anxiety and occasionally Ativan.. Patient has not noted any change in his hearing.  Past Medical History:  Diagnosis Date  . Anxiety   . Cluster headaches   . Hyperlipidemia    Past Surgical History:  Procedure Laterality Date  . NO PAST SURGERIES     Social History   Socioeconomic History  . Marital status: Married    Spouse name: Not on file  . Number of children: Not on file  . Years of education: Not on file  . Highest education level: Not on file  Occupational History  . Not on file  Tobacco Use  . Smoking status: Former Smoker    Packs/day: 0.50    Years: 12.00    Pack years: 6.00    Start date: 1996    Quit date: 2008    Years since quitting: 13.1  . Smokeless tobacco: Never Used  Substance and Sexual Activity  . Alcohol use: Yes    Comment: rare   . Drug use: No  . Sexual activity: Yes  Other Topics Concern  . Not on file  Social History Narrative   2 daughters -- healthy.    Social Determinants of Health   Financial Resource Strain:   . Difficulty of Paying Living Expenses: Not on file  Food Insecurity:   . Worried About Programme researcher, broadcasting/film/video in the Last Year: Not on file  . Ran Out of Food in the Last Year: Not on file  Transportation Needs:   . Lack of Transportation (Medical): Not on file  . Lack of Transportation (Non-Medical): Not on file  Physical Activity:   . Days of Exercise per Week: Not on file  . Minutes of Exercise per Session: Not on file   Stress:   . Feeling of Stress : Not on file  Social Connections:   . Frequency of Communication with Friends and Family: Not on file  . Frequency of Social Gatherings with Friends and Family: Not on file  . Attends Religious Services: Not on file  . Active Member of Clubs or Organizations: Not on file  . Attends Banker Meetings: Not on file  . Marital Status: Not on file   Family History  Problem Relation Age of Onset  . Diabetes Sister        x 2  . Stroke Paternal Grandfather        79s.   No Known Allergies Prior to Admission medications   Medication Sig Start Date End Date Taking? Authorizing Provider  atorvastatin (LIPITOR) 40 MG tablet Take 1 tablet (40 mg total) by mouth daily. 01/31/19  Yes Waldon Merl, PA-C  LORazepam (ATIVAN) 1 MG tablet Take 1 tablet (1 mg total) by mouth every 8 (eight) hours. 11/30/18  Yes Waldon Merl, PA-C  Omega-3 Fatty Acids (FISH OIL) 1200 MG CAPS Take 1 capsule by mouth daily. Takes 1400 mg daily   Yes [provider]  sertraline (ZOLOFT) 100 MG tablet Take 1 tablet (100 mg total) by mouth daily. 04/13/19  Yes Brunetta Jeans, PA-C  SUMAtriptan (IMITREX) 50 MG tablet Take 1 tablet (50 mg total) by mouth once for 1 dose. May repeat in 2 hours if headache persists or recurs. 05/28/17 06/22/19  Brunetta Jeans, PA-C     Positive ROS: Otherwise negative  All other systems have been reviewed and were otherwise negative with the exception of those mentioned in the HPI and as above.  Physical Exam: Constitutional: Alert, well-appearing, no acute distress Ears: External ears without lesions or tenderness. Ear canals are clear bilaterally with intact, clear TMs.  On tuning fork testing patient does have a very mild sensorineural hearing loss in both ears which is symmetric.  On Dix-Hallpike testing he has no evidence of BPPV. Nasal: External nose without lesions.  Mild rhinitis.  Clear nasal passages otherwise with no  signs of infection.  Both middle meatus regions are clear. Oral: Lips and gums without lesions. Tongue and palate mucosa without lesions. Posterior oropharynx clear. Neck: No palpable adenopathy or masses Respiratory: Breathing comfortably  Skin: No facial/neck lesions or rash noted.  Procedures  Assessment: Dizziness I suspect is probably more central in nature and not secondary to vestibular abnormality.  Possibly related to his anxiety or medications that he takes for anxiety.  Plan: We will go ahead and schedule patient for VNG testing and audiologic testing to evaluate vestibular and inner ear function as he is concerned about this and why it has persisted for 2 months. He will follow-up following the VNG testing and audiologic testing.  Radene Journey, MD   CC:

## 2019-07-26 DIAGNOSIS — R42 Dizziness and giddiness: Secondary | ICD-10-CM | POA: Diagnosis not present

## 2019-07-26 DIAGNOSIS — H903 Sensorineural hearing loss, bilateral: Secondary | ICD-10-CM | POA: Diagnosis not present

## 2019-08-02 ENCOUNTER — Other Ambulatory Visit: Payer: Self-pay

## 2019-08-02 ENCOUNTER — Ambulatory Visit (INDEPENDENT_AMBULATORY_CARE_PROVIDER_SITE_OTHER): Payer: BC Managed Care – PPO | Admitting: Otolaryngology

## 2019-08-02 DIAGNOSIS — H818X2 Other disorders of vestibular function, left ear: Secondary | ICD-10-CM

## 2019-08-02 DIAGNOSIS — Z23 Encounter for immunization: Secondary | ICD-10-CM | POA: Diagnosis not present

## 2019-08-02 NOTE — Progress Notes (Signed)
HPI: Danny Washington is a 44 y.o. male who returns today for evaluation of dizziness.  This has been chronic now for about 2 months and does not seem to be getting better.  He returns today following audiologic testing and VNG testing.  On audiologic testing this demonstrated symmetric hearing in both ears however on VNG testing he demonstrated geographic correction fix positional nystagmus consistent with peripheral pathology as well as left unilateral weakness of 62%.  Peripheral pathology was demonstrated in vestibular rehabilitation was recommended..  Past Medical History:  Diagnosis Date  . Anxiety   . Cluster headaches   . Hyperlipidemia    Past Surgical History:  Procedure Laterality Date  . NO PAST SURGERIES     Social History   Socioeconomic History  . Marital status: Married    Spouse name: Not on file  . Number of children: Not on file  . Years of education: Not on file  . Highest education level: Not on file  Occupational History  . Not on file  Tobacco Use  . Smoking status: Former Smoker    Packs/day: 0.50    Years: 12.00    Pack years: 6.00    Start date: 1996    Quit date: 2008    Years since quitting: 13.1  . Smokeless tobacco: Never Used  Substance and Sexual Activity  . Alcohol use: Yes    Comment: rare   . Drug use: No  . Sexual activity: Yes  Other Topics Concern  . Not on file  Social History Narrative   2 daughters -- healthy.    Social Determinants of Health   Financial Resource Strain:   . Difficulty of Paying Living Expenses: Not on file  Food Insecurity:   . Worried About Programme researcher, broadcasting/film/video in the Last Year: Not on file  . Ran Out of Food in the Last Year: Not on file  Transportation Needs:   . Lack of Transportation (Medical): Not on file  . Lack of Transportation (Non-Medical): Not on file  Physical Activity:   . Days of Exercise per Week: Not on file  . Minutes of Exercise per Session: Not on file  Stress:   . Feeling of Stress : Not  on file  Social Connections:   . Frequency of Communication with Friends and Family: Not on file  . Frequency of Social Gatherings with Friends and Family: Not on file  . Attends Religious Services: Not on file  . Active Member of Clubs or Organizations: Not on file  . Attends Banker Meetings: Not on file  . Marital Status: Not on file   Family History  Problem Relation Age of Onset  . Diabetes Sister        x 2  . Stroke Paternal Grandfather        104s.   No Known Allergies Prior to Admission medications   Medication Sig Start Date End Date Taking? Authorizing Provider  atorvastatin (LIPITOR) 40 MG tablet Take 1 tablet (40 mg total) by mouth daily. 01/31/19   Waldon Merl, PA-C  LORazepam (ATIVAN) 1 MG tablet Take 1 tablet (1 mg total) by mouth every 8 (eight) hours. 11/30/18   Waldon Merl, PA-C  Omega-3 Fatty Acids (FISH OIL) 1200 MG CAPS Take 1 capsule by mouth daily. Takes 1400 mg daily    [provider]  sertraline (ZOLOFT) 100 MG tablet Take 1 tablet (100 mg total) by mouth daily. 04/13/19   Waldon Merl, PA-C  SUMAtriptan (IMITREX) 50 MG tablet Take 1 tablet (50 mg total) by mouth once for 1 dose. May repeat in 2 hours if headache persists or recurs. 05/28/17 06/22/19  Brunetta Jeans, PA-C     Positive ROS: Otherwise negative  All other systems have been reviewed and were otherwise negative with the exception of those mentioned in the HPI and as above.  Physical Exam: Constitutional: Alert, well-appearing, no acute distress Ears: External ears without lesions or tenderness. Ear canals are clear bilaterally with intact, clear TMs.  Nasal: External nose without lesions. Clear nasal passages Oral: Lips and gums without lesions. Tongue and palate mucosa without lesions. Posterior oropharynx clear. Neck: No palpable adenopathy or masses Respiratory: Breathing comfortably  Skin: No facial/neck lesions or rash  noted.  Procedures  Assessment: Dizziness secondary to peripheral pathology noted on recent VNG testing.  Plan: We will go ahead and schedule MRI scan of the head to rule out any retrocochlear pathology that might be accounting for this vestibular weakness. I gave him information on BPPV to try and use. He will call us back following results of the MRI scan.  If this is negative we will plan on referral to physical therapy for vestibular rehab.   Radene Journey, MD

## 2019-08-03 ENCOUNTER — Other Ambulatory Visit (INDEPENDENT_AMBULATORY_CARE_PROVIDER_SITE_OTHER): Payer: Self-pay

## 2019-08-03 DIAGNOSIS — R42 Dizziness and giddiness: Secondary | ICD-10-CM

## 2019-08-04 ENCOUNTER — Encounter (INDEPENDENT_AMBULATORY_CARE_PROVIDER_SITE_OTHER): Payer: Self-pay

## 2019-08-31 DIAGNOSIS — Z23 Encounter for immunization: Secondary | ICD-10-CM | POA: Diagnosis not present

## 2019-09-03 ENCOUNTER — Other Ambulatory Visit: Payer: Self-pay

## 2019-09-03 ENCOUNTER — Ambulatory Visit
Admission: RE | Admit: 2019-09-03 | Discharge: 2019-09-03 | Disposition: A | Discharge status: Home / Self Care | Payer: BC Managed Care – PPO | Source: Ambulatory Visit | Attending: Otolaryngology | Admitting: Otolaryngology

## 2019-09-03 DIAGNOSIS — R42 Dizziness and giddiness: Secondary | ICD-10-CM | POA: Diagnosis not present

## 2019-09-03 MED ORDER — GADOBENATE DIMEGLUMINE 529 MG/ML IV SOLN
20.0000 mL | Freq: Once | INTRAVENOUS | Status: AC | PRN
  Administered 2019-09-03: 20 mL via INTRAVENOUS

## 2019-09-10 ENCOUNTER — Encounter (INDEPENDENT_AMBULATORY_CARE_PROVIDER_SITE_OTHER): Payer: Self-pay

## 2019-09-10 NOTE — Progress Notes (Unsigned)
[facility] 25 mg 1 QD QTY 90 w/1 refill.  Micro K+ 10 mlquin.  QTY. 90 w/1 refill, Take 1 QD. PM

## 2019-09-17 DIAGNOSIS — R42 Dizziness and giddiness: Secondary | ICD-10-CM | POA: Diagnosis not present

## 2019-09-20 ENCOUNTER — Encounter (INDEPENDENT_AMBULATORY_CARE_PROVIDER_SITE_OTHER): Payer: Self-pay

## 2019-09-23 DIAGNOSIS — R42 Dizziness and giddiness: Secondary | ICD-10-CM | POA: Diagnosis not present

## 2019-10-04 DIAGNOSIS — R42 Dizziness and giddiness: Secondary | ICD-10-CM | POA: Diagnosis not present

## 2019-10-12 DIAGNOSIS — F411 Generalized anxiety disorder: Secondary | ICD-10-CM | POA: Diagnosis not present

## 2019-10-14 DIAGNOSIS — R42 Dizziness and giddiness: Secondary | ICD-10-CM | POA: Diagnosis not present

## 2019-10-26 ENCOUNTER — Other Ambulatory Visit: Payer: Self-pay | Admitting: Physician Assistant

## 2019-10-26 ENCOUNTER — Encounter: Payer: Self-pay | Admitting: Physician Assistant

## 2019-10-26 DIAGNOSIS — F411 Generalized anxiety disorder: Secondary | ICD-10-CM | POA: Diagnosis not present

## 2019-10-26 DIAGNOSIS — E785 Hyperlipidemia, unspecified: Secondary | ICD-10-CM

## 2019-10-28 DIAGNOSIS — R42 Dizziness and giddiness: Secondary | ICD-10-CM | POA: Diagnosis not present

## 2019-11-03 DIAGNOSIS — F411 Generalized anxiety disorder: Secondary | ICD-10-CM | POA: Diagnosis not present

## 2019-11-03 DIAGNOSIS — Z20822 Contact with and (suspected) exposure to covid-19: Secondary | ICD-10-CM | POA: Diagnosis not present

## 2019-11-05 ENCOUNTER — Other Ambulatory Visit: Payer: Self-pay

## 2019-11-05 ENCOUNTER — Ambulatory Visit (INDEPENDENT_AMBULATORY_CARE_PROVIDER_SITE_OTHER)
Admission: RE | Admit: 2019-11-05 | Discharge: 2019-11-05 | Disposition: A | Discharge status: Home / Self Care | Payer: BC Managed Care – PPO | Source: Ambulatory Visit

## 2019-11-05 DIAGNOSIS — Z5189 Encounter for other specified aftercare: Secondary | ICD-10-CM

## 2019-11-05 MED ORDER — CEPHALEXIN 500 MG PO CAPS: 500.0000 mg | ORAL_CAPSULE | Freq: Four times a day (QID) | ORAL | 0 refills | Status: DC

## 2019-11-05 NOTE — ED Provider Notes (Signed)
Virtual Visit via Video Note:  [name]  initiated request for Telemedicine visit with [facility] Urgent Care team. I connected with [name]  on 11/05/2019 at 10:56 AM  for a synchronized telemedicine visit using a video enabled HIPPA compliant telemedicine application. I verified that I am speaking with [name]  using two identifiers. Alexxus Sobh Doree Fudge, PA-C  was physically located in a [facility] Urgent care site and Casmer Yepiz was located at a different location.   The limitations of evaluation and management by telemedicine as well as the availability of in-person appointments were discussed. Patient was informed that he  may incur a bill ( including co-pay) for this virtual visit encounter. [name]  expressed understanding and gave verbal consent to proceed with virtual visit.     History of Present Illness:Danny Washington  is a 44 y.o. male presents with 3 weeks of possible "boil". States started out as a pimple. Now getting larger, and more painful. Has been squeezing area without relief. States some warmth, no fever.   Painful to touch. Warm to touch. No fevers.   Squeezing area.   approx 1in x 0.75  Quarter sized.    Past Medical History:  Diagnosis Date  . Anxiety   . Cluster headaches   . Hyperlipidemia     No Known Allergies      Observations/Objective: General: Well appearing, nontoxic, no acute distress. Sitting comfortably. Head: Normocephalic, atraumatic Eye: No conjunctival injection, eyelid swelling. EOMI ENT: Mucus membranes moist, no lip cracking. No obvious nasal drainage. Pulm: Speaking in full sentences without difficulty. Normal effort. No respiratory distress, accessory muscle use. Skin: erythematous lesion approx 0.75 x 0.75 in per patient to left forearm, directly distal to the elbow. Patient states feels "quarter sized" hardness surrounding lesion. Area warm to touch.  Neuro: Normal mental status. Alert and oriented x 3.   Assessment and  Plan: Discussed in person evaluation vs treatment for possible cellulitis. Lower suspicion for abscess at this time. Possible inflammation causing worsening symptoms. Patient would like to start abx at this time. Keflex as directed. Warm compress. Return precautions given.  Follow Up Instructions:    I discussed the assessment and treatment plan with the patient. The patient was provided an opportunity to ask questions and all were answered. The patient agreed with the plan and demonstrated an understanding of the instructions.   The patient was advised to call back or seek an in-person evaluation if the symptoms worsen or if the condition fails to improve as anticipated.  I provided 10 minutes of non-face-to-face time during this encounter.    Belinda Fisher, PA-C  11/05/2019 10:56 AM         Belinda Fisher, PA-C 11/05/19 1155

## 2019-11-05 NOTE — Discharge Instructions (Signed)
Start keflex as directed to cover for any infection. The hardness may be partially inflammation from squeezing. Warm compresses. Ibuprofen/tylenol for pain as needed. Monitor for spreading erythema, warmth, fever, follow up for reevaluation.   [facility] at [facility]) [address] [place], [place], [place] [address] [phone]  [facility] at [facility] [address] [place], [place] [address] [phone]  [facility] Urgent Care at [facility] [address] [place], [place] [address] [phone]  [facility] at [facility] [address], [place], [place] [address] [phone]  [facility] Urgent Care at [facility] [address] [place], [place] [address] [phone]  [facility] at [facility] [address], [place], [place] [address] [phone]

## 2019-11-26 DIAGNOSIS — F411 Generalized anxiety disorder: Secondary | ICD-10-CM | POA: Diagnosis not present

## 2019-12-20 ENCOUNTER — Ambulatory Visit (INDEPENDENT_AMBULATORY_CARE_PROVIDER_SITE_OTHER)
Admission: RE | Admit: 2019-12-20 | Discharge: 2019-12-20 | Disposition: A | Discharge status: Home / Self Care | Payer: BC Managed Care – PPO | Source: Ambulatory Visit

## 2019-12-20 DIAGNOSIS — J0100 Acute maxillary sinusitis, unspecified: Secondary | ICD-10-CM

## 2019-12-20 MED ORDER — AMOXICILLIN 875 MG PO TABS: 875.0000 mg | ORAL_TABLET | Freq: Two times a day (BID) | ORAL | 0 refills | Status: AC

## 2019-12-20 NOTE — ED Provider Notes (Signed)
Virtual Visit via Video Note:  Danny Washington  initiated request for Telemedicine visit with [facility] Urgent Care team. I connected with Danny Washington  on 12/20/2019 at 2:50 PM  for a synchronized telemedicine visit using a video enabled HIPPA compliant telemedicine application. I verified that I am speaking with Danny Washington  using two identifiers. Mickie Bail, NP  was physically located in a [facility] Urgent care site and Mickle Campton was located at a different location.   The limitations of evaluation and management by telemedicine as well as the availability of in-person appointments were discussed. Patient was informed that he  may incur a bill ( including co-pay) for this virtual visit encounter. Danny Washington  expressed understanding and gave verbal consent to proceed with virtual visit.     History of Present Illness:Danny Washington  is a 44 y.o. male presents for evaluation of 2 week history of sinus pressure, postnasal drip, and congestion.  He states he started with "cold symptoms" which resolved; the sinus symptoms have lingered.  He denies fever, chills, sore throat, shortness of breath, vomiting, diarrhea, rash, or other symptoms.  Taking Tylenol and Sudafed for symptoms.     No Known Allergies   Past Medical History:  Diagnosis Date   Anxiety    Cluster headaches    Hyperlipidemia      Social History   Tobacco Use   Smoking status: Former Smoker    Packs/day: 0.50    Years: 12.00    Pack years: 6.00    Start date: 1996    Quit date: 2008    Years since quitting: 13.5   Smokeless tobacco: Never Used  Building services engineer Use: Never used  Substance Use Topics   Alcohol use: Yes    Comment: rare    Drug use: No    ROS: as stated in HPI.  All other systems reviewed and negative.      Observations/Objective: Physical Exam  VITALS: Patient denies fever. GENERAL: Alert, appears well and in no acute distress. HEENT: Atraumatic. Oral mucosa appears  moist. NECK: Normal movements of the head and neck. CARDIOPULMONARY: No increased WOB. Speaking in clear sentences. I:E ratio WNL.  MS: Moves all visible extremities without noticeable abnormality. PSYCH: Pleasant and cooperative, well-groomed. Speech normal rate and rhythm. Affect is appropriate. Insight and judgement are appropriate. Attention is focused, linear, and appropriate.  NEURO: CN grossly intact. Oriented as arrived to appointment on time with no prompting. Moves both UE equally.  SKIN: No obvious lesions, wounds, erythema, or cyanosis noted on face or hands.   Assessment and Plan:    ICD-10-CM   1. Acute non-recurrent maxillary sinusitis  J01.00        Follow Up Instructions: Treating with amoxicillin.  Instructed patient to follow-up with his PCP or come here to be seen in person if his symptoms are not improving.  Patient agrees to plan of care.      I discussed the assessment and treatment plan with the patient. The patient was provided an opportunity to ask questions and all were answered. The patient agreed with the plan and demonstrated an understanding of the instructions.   The patient was advised to call back or seek an in-person evaluation if the symptoms worsen or if the condition fails to improve as anticipated.      Mickie Bail, NP  12/20/2019 2:50 PM         Mickie Bail, NP 12/20/19 1450

## 2019-12-20 NOTE — Discharge Instructions (Signed)
Take the amoxicillin as directed.    Follow up with your primary care provider or come here to be seen in person if your symptoms are not improving.

## 2019-12-31 ENCOUNTER — Encounter (INDEPENDENT_AMBULATORY_CARE_PROVIDER_SITE_OTHER): Payer: Self-pay

## 2020-01-03 ENCOUNTER — Encounter (INDEPENDENT_AMBULATORY_CARE_PROVIDER_SITE_OTHER): Payer: Self-pay

## 2020-01-28 ENCOUNTER — Encounter: Payer: Self-pay | Admitting: Physician Assistant

## 2020-01-28 ENCOUNTER — Other Ambulatory Visit: Payer: Self-pay

## 2020-01-28 ENCOUNTER — Ambulatory Visit (INDEPENDENT_AMBULATORY_CARE_PROVIDER_SITE_OTHER): Payer: BC Managed Care – PPO | Admitting: Physician Assistant

## 2020-01-28 VITALS — BP 122/84 | HR 70 | Temp 98.1°F | Resp 16 | Ht 73.5 in | Wt 236.0 lb

## 2020-01-28 DIAGNOSIS — Z1159 Encounter for screening for other viral diseases: Secondary | ICD-10-CM

## 2020-01-28 DIAGNOSIS — F411 Generalized anxiety disorder: Secondary | ICD-10-CM | POA: Diagnosis not present

## 2020-01-28 DIAGNOSIS — E669 Obesity, unspecified: Secondary | ICD-10-CM | POA: Diagnosis not present

## 2020-01-28 DIAGNOSIS — E785 Hyperlipidemia, unspecified: Secondary | ICD-10-CM | POA: Diagnosis not present

## 2020-01-28 LAB — LIPID PANEL
Cholesterol: 159 mg/dL (ref 0–200)
HDL: 33.6 mg/dL — ABNORMAL LOW
LDL Cholesterol: 86 mg/dL (ref 0–99)
NonHDL: 125.15
Total CHOL/HDL Ratio: 5
Triglycerides: 194 mg/dL — ABNORMAL HIGH (ref 0.0–149.0)
VLDL: 38.8 mg/dL (ref 0.0–40.0)

## 2020-01-28 LAB — HEPATIC FUNCTION PANEL
ALT: 16 U/L (ref 0–53)
AST: 14 U/L (ref 0–37)
Albumin: 4.8 g/dL (ref 3.5–5.2)
Alkaline Phosphatase: 67 U/L (ref 39–117)
Bilirubin, Direct: 0.2 mg/dL (ref 0.0–0.3)
Total Bilirubin: 1.3 mg/dL — ABNORMAL HIGH (ref 0.2–1.2)
Total Protein: 6.9 g/dL (ref 6.0–8.3)

## 2020-01-28 MED ORDER — ESCITALOPRAM OXALATE 10 MG PO TABS: 10.0000 mg | ORAL_TABLET | Freq: Every day | ORAL | 1 refills | Status: DC

## 2020-01-28 MED ORDER — LORAZEPAM 1 MG PO TABS: 1.0000 mg | ORAL_TABLET | Freq: Three times a day (TID) | ORAL | 0 refills | Status: AC

## 2020-01-28 NOTE — Patient Instructions (Signed)
I am glad you are doing so well. Keep up with diet and exercise, working towards goal of at least 150 minutes per week.   Please go to the lab today for blood work.  I will call you with your results. We will alter treatment regimen(s) if indicated by your results.

## 2020-01-28 NOTE — Progress Notes (Signed)
Patient presents to clinic today for follow-up of hyperlipidemia. Patient is currently on a regimen of atorvastatin 40 mg daily. Endorses taking medication as directed along with working on diet and exercise. Is working out a few times per week and playing tennis. Is making better food choices and watching portion sizes. Notes good hydration, good sleep and stable mood.    Past Medical History:  Diagnosis Date  . Anxiety   . Cluster headaches   . Hyperlipidemia     Current Outpatient Medications on File Prior to Visit  Medication Sig Dispense Refill  . atorvastatin (LIPITOR) 40 MG tablet TAKE 1 TABLET BY MOUTH EVERY DAY 90 tablet 1  . escitalopram (LEXAPRO) 10 MG tablet Take 10 mg by mouth daily.    Marland Kitchen LORazepam (ATIVAN) 1 MG tablet Take 1 tablet (1 mg total) by mouth every 8 (eight) hours. 30 tablet 0  . Omega-3 Fatty Acids (FISH OIL) 1200 MG CAPS Take 1 capsule by mouth daily. Takes 1400 mg daily    . [DISCONTINUED] sertraline (ZOLOFT) 100 MG tablet Take 1 tablet (100 mg total) by mouth daily. 90 tablet 1   No current facility-administered medications on file prior to visit.    No Known Allergies  Family History  Problem Relation Age of Onset  . Diabetes Sister        x 2  . Stroke Paternal Grandfather        52s.    Social History   Socioeconomic History  . Marital status: Married    Spouse name: Not on file  . Number of children: Not on file  . Years of education: Not on file  . Highest education level: Not on file  Occupational History  . Not on file  Tobacco Use  . Smoking status: Former Smoker    Packs/day: 0.50    Years: 12.00    Pack years: 6.00    Start date: 1996    Quit date: 2008    Years since quitting: 13.6  . Smokeless tobacco: Never Used  Vaping Use  . Vaping Use: Never used  Substance and Sexual Activity  . Alcohol use: Yes    Comment: rare   . Drug use: No  . Sexual activity: Yes  Other Topics Concern  . Not on file  Social History  Narrative   2 daughters -- healthy.    Social Determinants of Health   Financial Resource Strain:   . Difficulty of Paying Living Expenses: Not on file  Food Insecurity:   . Worried About Programme researcher, broadcasting/film/video in the Last Year: Not on file  . Ran Out of Food in the Last Year: Not on file  Transportation Needs:   . Lack of Transportation (Medical): Not on file  . Lack of Transportation (Non-Medical): Not on file  Physical Activity:   . Days of Exercise per Week: Not on file  . Minutes of Exercise per Session: Not on file  Stress:   . Feeling of Stress : Not on file  Social Connections:   . Frequency of Communication with Friends and Family: Not on file  . Frequency of Social Gatherings with Friends and Family: Not on file  . Attends Religious Services: Not on file  . Active Member of Clubs or Organizations: Not on file  . Attends Banker Meetings: Not on file  . Marital Status: Not on file   Review of Systems - See HPI.  All other ROS are negative.  BP  122/84   Pulse 70   Temp 98.1 F (36.7 C) (Temporal)   Resp 16   Ht 6' 1.5" (1.867 m)   Wt 236 lb (107 kg)   SpO2 98%   BMI 30.71 kg/m   Physical Exam Vitals reviewed.  Constitutional:      Appearance: Normal appearance.  HENT:     Head: Normocephalic and atraumatic.     Right Ear: Tympanic membrane normal.     Left Ear: Tympanic membrane normal.     Mouth/Throat:     Mouth: Mucous membranes are moist.  Cardiovascular:     Rate and Rhythm: Normal rate and regular rhythm.     Pulses: Normal pulses.     Heart sounds: Normal heart sounds.  Pulmonary:     Effort: Pulmonary effort is normal.     Breath sounds: Normal breath sounds.  Musculoskeletal:     Cervical back: Neck supple.  Neurological:     General: No focal deficit present.     Mental Status: He is alert and oriented to person, place, and time.  Psychiatric:        Mood and Affect: Mood normal.    Assessment/Plan: 1. Hyperlipidemia,  unspecified hyperlipidemia type Taking statin as directed and tolerating well. Continue medication. Dietary and exercise recommendations reviewed. Repeat fasting labs today. - Lipid panel - Hepatic function panel  2. Obesity (BMI 30-39.9) Body mass index is 30.71 kg/m. Is working hard on exercise and diet. Recommendations again reviewed with patient.   3. Need for hepatitis C screening test - Hepatitis C Antibody  4. Generalized anxiety disorder Stable. Needs med refills. Medications sent.  - LORazepam (ATIVAN) 1 MG tablet; Take 1 tablet (1 mg total) by mouth every 8 (eight) hours.  Dispense: 30 tablet; Refill: 0  This visit occurred during the SARS-CoV-2 public health emergency.  Safety protocols were in place, including screening questions prior to the visit, additional usage of staff PPE, and extensive cleaning of exam room while observing appropriate contact time as indicated for disinfecting solutions.     Piedad Climes, PA-C

## 2020-02-01 LAB — HEPATITIS C ANTIBODY
Hepatitis C Ab: NONREACTIVE
SIGNAL TO CUT-OFF: 0.02 (ref <1.00)

## 2020-02-08 ENCOUNTER — Encounter: Payer: Self-pay | Admitting: Physician Assistant

## 2020-02-08 DIAGNOSIS — E785 Hyperlipidemia, unspecified: Secondary | ICD-10-CM

## 2020-02-08 MED ORDER — ATORVASTATIN CALCIUM 40 MG PO TABS: 40.0000 mg | ORAL_TABLET | Freq: Every day | ORAL | 1 refills | Status: DC

## 2020-07-21 ENCOUNTER — Other Ambulatory Visit: Payer: Self-pay | Admitting: Physician Assistant

## 2020-10-25 ENCOUNTER — Other Ambulatory Visit: Payer: Self-pay

## 2020-10-25 DIAGNOSIS — E785 Hyperlipidemia, unspecified: Secondary | ICD-10-CM

## 2020-10-25 MED ORDER — ATORVASTATIN CALCIUM 40 MG PO TABS: 40.0000 mg | ORAL_TABLET | Freq: Every day | ORAL | 0 refills | Status: DC

## 2021-08-31 ENCOUNTER — Encounter (HOSPITAL_BASED_OUTPATIENT_CLINIC_OR_DEPARTMENT_OTHER): Payer: Self-pay | Admitting: Emergency Medicine

## 2021-08-31 ENCOUNTER — Emergency Department (HOSPITAL_BASED_OUTPATIENT_CLINIC_OR_DEPARTMENT_OTHER)
Admission: EM | Admit: 2021-08-31 | Discharge: 2021-08-31 | Disposition: A | Discharge status: Home / Self Care | Payer: 59 | Attending: Emergency Medicine | Admitting: Emergency Medicine

## 2021-08-31 ENCOUNTER — Other Ambulatory Visit: Payer: Self-pay

## 2021-08-31 ENCOUNTER — Emergency Department (HOSPITAL_BASED_OUTPATIENT_CLINIC_OR_DEPARTMENT_OTHER): Payer: 59 | Admitting: Radiology

## 2021-08-31 DIAGNOSIS — R079 Chest pain, unspecified: Secondary | ICD-10-CM | POA: Diagnosis present

## 2021-08-31 DIAGNOSIS — R7989 Other specified abnormal findings of blood chemistry: Secondary | ICD-10-CM | POA: Diagnosis not present

## 2021-08-31 DIAGNOSIS — R61 Generalized hyperhidrosis: Secondary | ICD-10-CM | POA: Diagnosis not present

## 2021-08-31 DIAGNOSIS — K219 Gastro-esophageal reflux disease without esophagitis: Secondary | ICD-10-CM | POA: Diagnosis not present

## 2021-08-31 DIAGNOSIS — R0789 Other chest pain: Secondary | ICD-10-CM

## 2021-08-31 LAB — CBC
HCT: 42.8 % (ref 39.0–52.0)
Hemoglobin: 14.8 g/dL (ref 13.0–17.0)
MCH: 29.8 pg (ref 26.0–34.0)
MCHC: 34.6 g/dL (ref 30.0–36.0)
MCV: 86.3 fL (ref 80.0–100.0)
Platelets: 235 10*3/uL (ref 150–400)
RBC: 4.96 MIL/uL (ref 4.22–5.81)
RDW: 12.3 % (ref 11.5–15.5)
WBC: 7.5 10*3/uL (ref 4.0–10.5)
nRBC: 0 % (ref 0.0–0.2)

## 2021-08-31 LAB — BASIC METABOLIC PANEL
Anion gap: 9 (ref 5–15)
BUN: 25 mg/dL — ABNORMAL HIGH (ref 6–20)
CO2: 28 mmol/L (ref 22–32)
Calcium: 10 mg/dL (ref 8.9–10.3)
Chloride: 103 mmol/L (ref 98–111)
Creatinine, Ser: 1.3 mg/dL — ABNORMAL HIGH (ref 0.61–1.24)
GFR, Estimated: >60 mL/min (ref >60)
Glucose, Bld: 117 mg/dL — ABNORMAL HIGH (ref 70–99)
Potassium: 3.6 mmol/L (ref 3.5–5.1)
Sodium: 140 mmol/L (ref 135–145)

## 2021-08-31 LAB — TROPONIN I (HIGH SENSITIVITY)
Troponin I (High Sensitivity): 2 ng/L (ref <18)
Troponin I (High Sensitivity): <2 ng/L (ref <18)

## 2021-08-31 MED ORDER — ALUM & MAG HYDROXIDE-SIMETH 200-200-20 MG/5ML PO SUSP
30.0000 mL | Freq: Once | ORAL | Status: AC
  Administered 2021-08-31: 30 mL via ORAL
  Filled 2021-08-31: qty 30

## 2021-08-31 MED ORDER — PANTOPRAZOLE SODIUM 40 MG PO TBEC: 40.0000 mg | DELAYED_RELEASE_TABLET | Freq: Every day | ORAL | 0 refills | Status: AC

## 2021-08-31 NOTE — ED Provider Notes (Signed)
? ?[facility]  ?Provider Note ? ?CSN: [id number] ?Arrival date & time: 08/31/21 0237 ? ?History ?Chief Complaint  ?Patient presents with  ? Chest Pain  ? ? ?Danny Washington is a 46 y.o. male with history of HLD but no known CAD reports he woke up during the night with a tightness/burning in his upper chest/throat he initially thought was GERD but pain moved into his neck and L arm, associated with diaphoresis. He has had panic attacks before and so he took an Ativan but did not feel better prompting him to come to the ED for evaluation.  ? ? ?Home Medications ?Prior to Admission medications   ?Medication Sig Start Date End Date Taking? Authorizing Provider  ?pantoprazole (PROTONIX) 40 MG tablet Take 1 tablet (40 mg total) by mouth daily. 08/31/21  Yes Truddie Hidden, MD  ?atorvastatin (LIPITOR) 40 MG tablet Take 1 tablet (40 mg total) by mouth daily. 10/25/20   Kennyth Arnold, FNP  ?escitalopram (LEXAPRO) 10 MG tablet TAKE 1 TABLET BY MOUTH EVERY DAY 07/21/20   Kennyth Arnold, FNP  ?LORazepam (ATIVAN) 1 MG tablet Take 1 tablet (1 mg total) by mouth every 8 (eight) hours. 01/28/20   Brunetta Jeans, PA-C  ?Omega-3 Fatty Acids (FISH OIL) 1200 MG CAPS Take 1 capsule by mouth daily. Takes 1400 mg daily    [provider]  ?sertraline (ZOLOFT) 100 MG tablet Take 1 tablet (100 mg total) by mouth daily. 04/13/19 11/05/19  Brunetta Jeans, PA-C  ? ? ? ?Allergies    ?Patient has no known allergies. ? ? ?Review of Systems   ?Review of Systems ?Please see HPI for pertinent positives and negatives ? ?Physical Exam ?BP 106/73   Pulse 71   Temp 97.9 ?F (36.6 ?C)   Resp 17   Ht 6\' 1"  (1.854 m)   Wt 114.3 kg   SpO2 94%   BMI 33.25 kg/m?  ? ?Physical Exam ?Vitals and nursing note reviewed.  ?Constitutional:   ?   Appearance: Normal appearance.  ?HENT:  ?   Head: Normocephalic and atraumatic.  ?   Nose: Nose normal.  ?   Mouth/Throat:  ?   Mouth: Mucous membranes are moist.  ?Eyes:  ?    Extraocular Movements: Extraocular movements intact.  ?   Conjunctiva/sclera: Conjunctivae normal.  ?Cardiovascular:  ?   Rate and Rhythm: Normal rate.  ?Pulmonary:  ?   Effort: Pulmonary effort is normal.  ?   Breath sounds: Normal breath sounds.  ?Abdominal:  ?   General: Abdomen is flat.  ?   Palpations: Abdomen is soft.  ?   Tenderness: There is no abdominal tenderness.  ?Musculoskeletal:     ?   General: No swelling. Normal range of motion.  ?   Cervical back: Neck supple.  ?Skin: ?   General: Skin is warm and dry.  ?Neurological:  ?   General: No focal deficit present.  ?   Mental Status: He is alert.  ?Psychiatric:     ?   Mood and Affect: Mood normal.  ? ? ?ED Results / Procedures / Treatments   ?EKG ?EKG Interpretation ? ?Date/Time:  Friday August 31 2021 02:42:31 EDT ?Ventricular Rate:  107 ?PR Interval:  152 ?QRS Duration: 104 ?QT Interval:  348 ?QTC Calculation: 464 ?R Axis:   20 ?Text Interpretation: Sinus tachycardia Cannot rule out Inferior infarct , age undetermined Abnormal ECG When compared with ECG of 21-Aug-2004 03:17, No significant change was  found Confirmed by Calvert Cantor [phone]) on 08/31/2021 2:45:53 AM ? ?Procedures ?Procedures ? ?Medications Ordered in the ED ?Medications  ?alum & mag hydroxide-simeth (MAALOX/MYLANTA) 200-200-20 MG/5ML suspension 30 mL (30 mLs Oral Given 08/31/21 0314)  ? ? ?Initial Impression and Plan ? Patient with chest/throat discomfort that went into his arm. Most consistent with GERD/laryngospasm, but could be cardiac given radiation into arm. EKG without acute ischemic changes. Will check labs and CXR. GI cocktail for discomfort.  ? ?ED Course  ? ?Clinical Course as of 08/31/21 0517  ?Fri Aug 31, 2021  ?0315 CBC is normal.  [CS]  ?0320 I personally viewed the images from radiology studies and agree with radiologist interpretation: No acute process ? [CS]  ?0405 BMP with mild increase in Cr from baseline, otherwise no significant findings.  [CS]  ?85 First trop is  normal. Will monitor and repeat to determine delta.  [CS]  ?0513 Patient's Trop remains normal. He reports some improvement after GI cocktail but still has some irritation to the back of his throat. Low risk for ACS or MACE. Plan discharge with Rx for PPI. PCP follow up. RTED for any worsening.  [CS]  ?  ?Clinical Course User Index ?[CS] Truddie Hidden, MD  ? ? ? ?MDM Rules/Calculators/A&P ?Medical Decision Making ?Given presenting complaint, I considered that admission might be necessary. After review of results from ED lab and/or imaging studies, admission to the hospital is not indicated at this time.  ? ? ?Problems Addressed: ?Atypical chest pain: acute illness or injury ?Gastroesophageal reflux disease, unspecified whether esophagitis present: chronic illness or injury with exacerbation, progression, or side effects of treatment ? ?Amount and/or Complexity of Data Reviewed ?Labs: ordered. Decision-making details documented in ED Course. ?Radiology: ordered and independent interpretation performed. Decision-making details documented in ED Course. ?ECG/medicine tests: ordered and independent interpretation performed. Decision-making details documented in ED Course. ? ?Risk ?OTC drugs. ?Prescription drug management. ?Decision regarding hospitalization. ? ? ? ?Final Clinical Impression(s) / ED Diagnoses ?Final diagnoses:  ?Atypical chest pain  ?Gastroesophageal reflux disease, unspecified whether esophagitis present  ? ? ?Rx / DC Orders ?ED Discharge Orders   ? ?      Ordered  ?  pantoprazole (PROTONIX) 40 MG tablet  Daily       ? 08/31/21 0516  ? ?  ?  ? ?  ? ?  ?Truddie Hidden, MD ?08/31/21 [phone] ? ?

## 2021-08-31 NOTE — ED Triage Notes (Addendum)
Pt went to bed with an acid reflux sensation and woke up just prior to arrival with L chest burning (radiation into L arm, tingling in arm and neck, diaphoresis, dizziness).  ?Has worn a holter monitor in the past and was diagnosed with panic disorder after it showed no events, tried taking ativan prior to coming tonight. No heart history otherwise. ?Uses chewing tobacco. H/o hld ?

## 2021-08-31 NOTE — ED Notes (Signed)
Patient transported to X-ray 

## 2021-11-16 IMAGING — MR MR BRAIN/IAC WO/W CM
11 of 12 series · 43 of 48 positions shown · IV contrast (multihance)
Comparison: None.

CLINICAL DATA: 43-year-old male with 3 months of dizziness. No
known injury.

EXAM:
MRI HEAD WITHOUT AND WITH CONTRAST
TECHNIQUE: Multiplanar, multiecho pulse sequences of the brain and surrounding
structures were obtained without and with intravenous contrast.
CONTRAST:  20mL MULTIHANCE GADOBENATE DIMEGLUMINE 529 MG/ML IV SOLN

[Series 5: T1 · sagittal · 4.0mm · 0.72mm/px · 3 of 28 slices shown (1 of 3)]
[im 1/28]
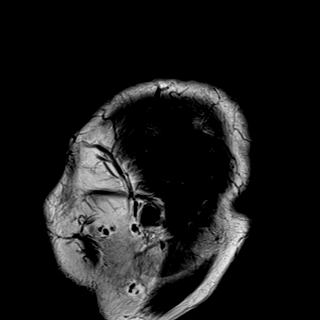
[im 14/28]
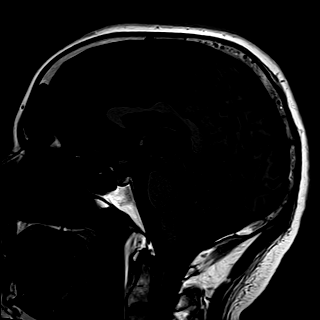
[im 28/28]
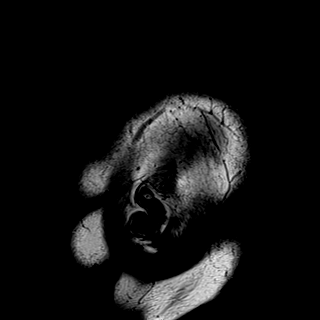

[Series 6: DWI · axial · 3.0mm · 1.44mm/px · z∈[-57,+107]mm · 8 of 88 slices shown (1 of 2)]
[im 1/88]
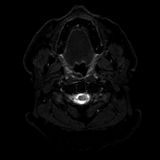
[im 13/88]
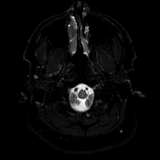
[im 25/88]
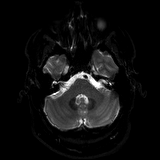
[im 38/88]
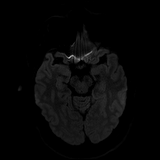
[im 50/88]
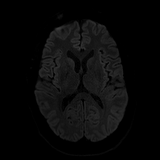
[im 63/88]
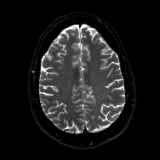
[im 75/88]
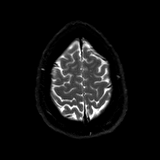
[im 88/88]
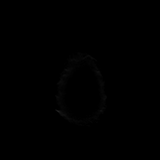

[Series 7: DWI · axial · 3.0mm · 1.44mm/px · z∈[-57,+107]mm · 4 of 44 slices shown (2 of 2)]
[im 1/44]
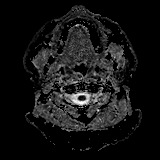
[im 15/44]
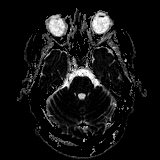
[im 29/44]
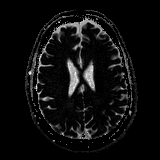
[im 44/44]
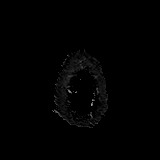

[Series 8: T2 · axial · 4.0mm · 0.36mm/px · z∈[-43,+100]mm · 2 of 28 slices shown]
[im 1/28]
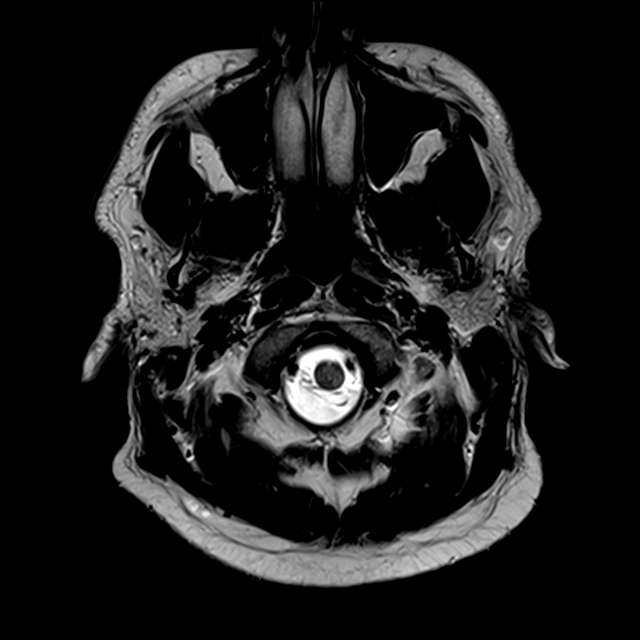
[im 28/28]
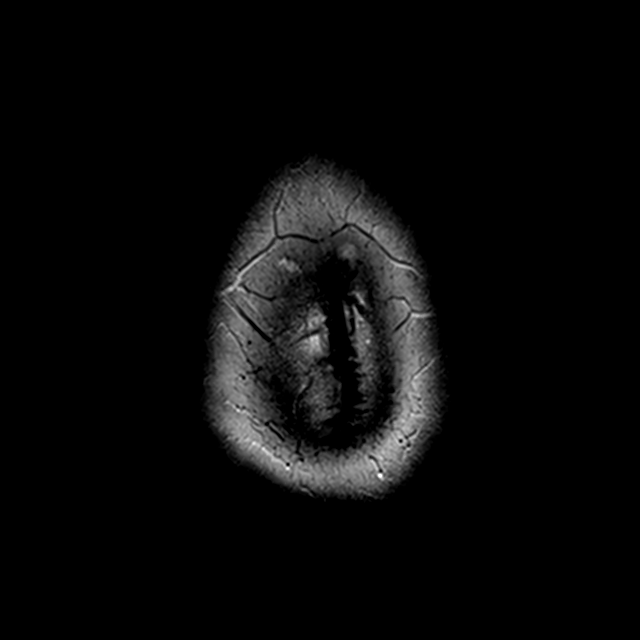

[Series 9: FLAIR · axial · 3.0mm · 0.72mm/px · z∈[-46,+104]mm · 2 of 28 slices shown]
[im 1/28]
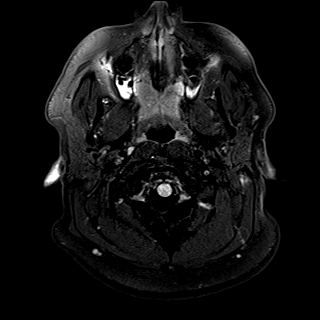
[im 28/28]
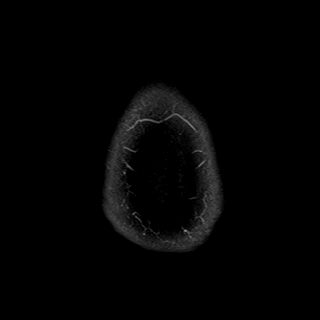

[Series 11: swi_images · axial · 2.0mm · 0.90mm/px · z∈[-48,+106]mm · 7 of 80 slices shown]
[im 1/80]
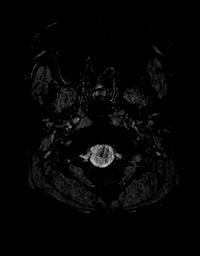
[im 14/80]
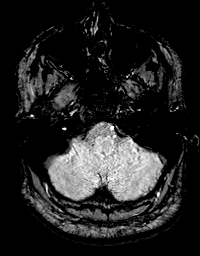
[im 27/80]
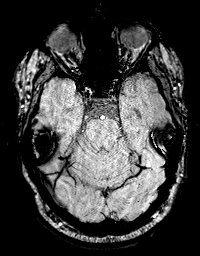
[im 40/80]
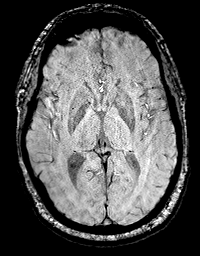
[im 53/80]
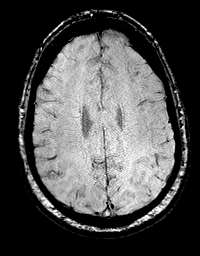
[im 66/80]
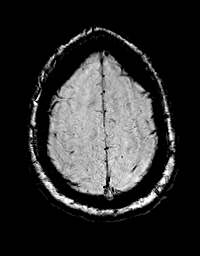
[im 80/80]
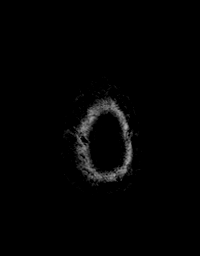

[Series 12: T1 · coronal · 2.5mm · 0.56mm/px · 1 of 13 slices shown (2 of 3)]
[im 1/13]
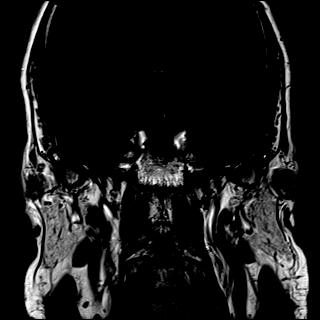

[Series 13: T1 · axial · 2.5mm · 0.50mm/px · 1 of 13 slices shown (3 of 3)]
[im 1/13]
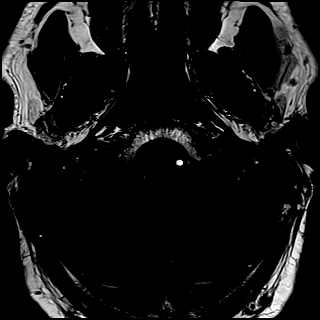

[Series 15: T1 post-contrast · coronal · 2.5mm · 0.56mm/px · 1 of 13 slices shown (1 of 3)]
[im 1/13]
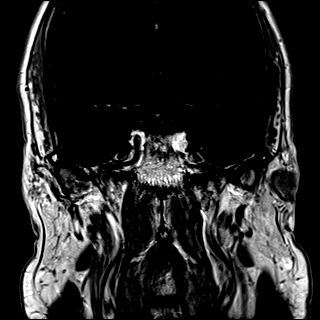

[Series 16: T1 post-contrast · axial · 2.5mm · 0.50mm/px · 1 of 13 slices shown (2 of 3)]
[im 1/13]
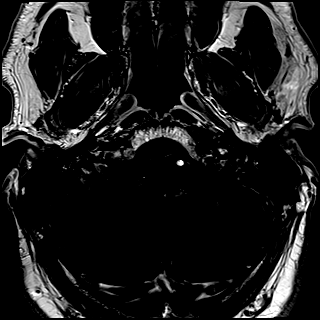

[Series 17: T1 post-contrast · axial · 1.0mm · 0.90mm/px · z∈[-49,+107]mm · 13 of 160 slices shown (3 of 3)]
[im 1/160]
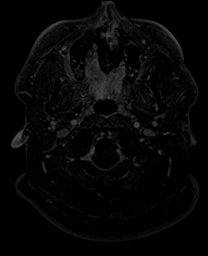
[im 14/160]
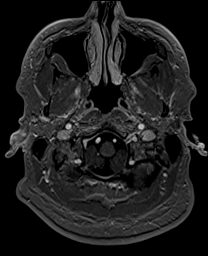
[im 27/160]
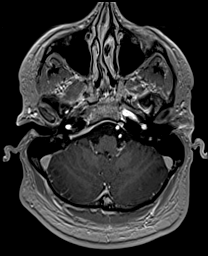
[im 40/160]
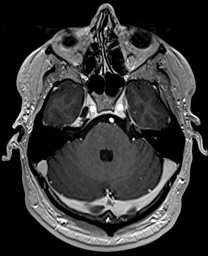
[im 54/160]
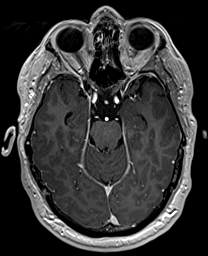
[im 67/160]
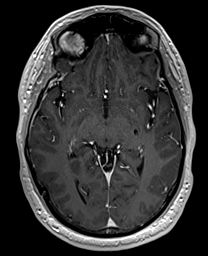
[im 80/160]
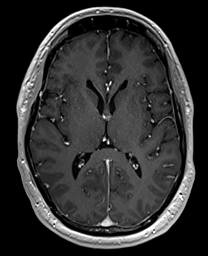
[im 93/160]
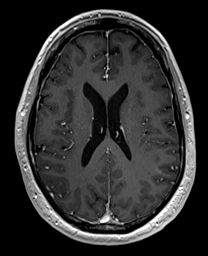
[im 107/160]
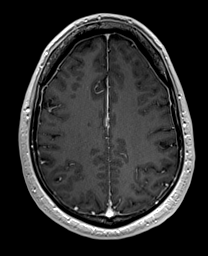
[im 120/160]
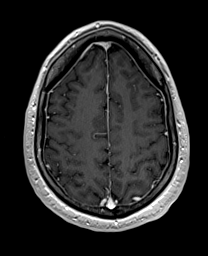
[im 133/160]
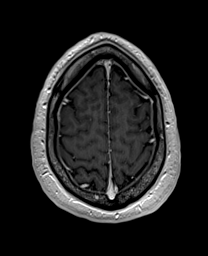
[im 146/160]
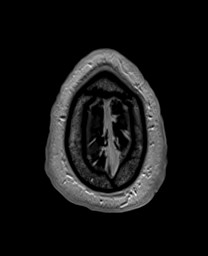
[im 160/160]
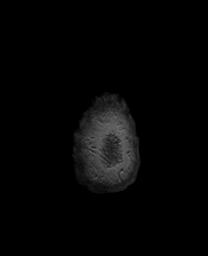

[43 of 48 positions shown; findings below may reference images not displayed]

FINDINGS: Brain: No restricted diffusion to suggest acute infarction. No
midline shift, mass effect, evidence of mass lesion,
ventriculomegaly, extra-axial collection or acute intracranial
hemorrhage. Cervicomedullary junction and pituitary are within
normal limits.

Gray and white matter signal is within normal limits for age
throughout the brain. No cortical encephalomalacia. No chronic
cerebral blood products. The deep gray nuclei, brainstem and
cerebellum appear normal. No abnormal enhancement identified.

Vascular: Major intracranial vascular flow voids are preserved.

Skull and upper cervical spine: Normal visible cervical spine and
bone marrow signal.

Sinuses/Orbits: Negative orbits. Paranasal sinuses are clear.
Visible scalp and face soft tissues appear negative.

Other: Dedicated internal auditory imaging. Normal cerebellopontine
angles. Normal bilateral cisternal and intracanalicular 7th and 8th
cranial nerve segments. Symmetric T2 signal in the bilateral cochlea
and vestibular structures. Mastoids are clear. Stylomastoid foramina
appear normal. No abnormal enhancement identified. Parotid glands
appear normal. No skull base abnormality identified.
IMPRESSION: 1. Normal internal auditory imaging.
2. MRI appearance of the brain is within normal limits for age.

## 2023-04-18 ENCOUNTER — Ambulatory Visit (AMBULATORY_SURGERY_CENTER): Payer: Managed Care, Other (non HMO)

## 2023-04-18 VITALS — Ht 73.0 in | Wt 240.0 lb

## 2023-04-18 DIAGNOSIS — Z1211 Encounter for screening for malignant neoplasm of colon: Secondary | ICD-10-CM

## 2023-04-18 MED ORDER — NA SULFATE-K SULFATE-MG SULF 17.5-3.13-1.6 GM/177ML PO SOLN: 1.0000 | Freq: Once | ORAL | 0 refills | Status: AC

## 2023-04-18 NOTE — Progress Notes (Signed)

## 2023-05-14 ENCOUNTER — Encounter: Payer: Self-pay | Admitting: Gastroenterology

## 2023-05-16 ENCOUNTER — Encounter: Payer: Self-pay | Admitting: Gastroenterology

## 2023-05-16 ENCOUNTER — Ambulatory Visit: Payer: Managed Care, Other (non HMO) | Admitting: Gastroenterology

## 2023-05-16 VITALS — BP 122/43 | HR 69 | Temp 98.2°F | Resp 15 | Ht 73.0 in | Wt 240.0 lb

## 2023-05-16 DIAGNOSIS — K644 Residual hemorrhoidal skin tags: Secondary | ICD-10-CM | POA: Diagnosis not present

## 2023-05-16 DIAGNOSIS — K648 Other hemorrhoids: Secondary | ICD-10-CM

## 2023-05-16 DIAGNOSIS — K635 Polyp of colon: Secondary | ICD-10-CM | POA: Diagnosis not present

## 2023-05-16 DIAGNOSIS — Z1211 Encounter for screening for malignant neoplasm of colon: Secondary | ICD-10-CM

## 2023-05-16 DIAGNOSIS — D125 Benign neoplasm of sigmoid colon: Secondary | ICD-10-CM

## 2023-05-16 MED ORDER — SODIUM CHLORIDE 0.9 % IV SOLN: 500.0000 mL | INTRAVENOUS | Status: DC

## 2023-05-16 NOTE — Progress Notes (Signed)
[facility] History and Physical   Primary Care Physician:  Eartha Inch, MD   Reason for Procedure:  Colorectal cancer screening  Plan:    Screening colonoscopy with possible interventions as needed     HPI: Danny Washington is a very pleasant 47 y.o. male here for screening colonoscopy. Denies any nausea, vomiting, abdominal pain, melena or bright red blood per rectum  The risks and benefits as well as alternatives of endoscopic procedure(s) have been discussed and reviewed. All questions answered. The patient agrees to proceed.    Past Medical History:  Diagnosis Date   Anxiety    Cluster headaches    Hyperlipidemia     Past Surgical History:  Procedure Laterality Date   NO PAST SURGERIES     WISDOM TOOTH EXTRACTION      Prior to Admission medications   Medication Sig Start Date End Date Taking? Authorizing Provider  amLODipine (NORVASC) 5 MG tablet Take 5 mg by mouth daily. 05/12/23  Yes [provider]  LORazepam (ATIVAN) 1 MG tablet Take 1 tablet (1 mg total) by mouth every 8 (eight) hours. 01/28/20  Yes Waldon Merl, PA-C  Omega-3 Fatty Acids (FISH OIL) 1200 MG CAPS Take 1 capsule by mouth daily. Takes 1400 mg daily   Yes [provider]  rosuvastatin (CRESTOR) 20 MG tablet Take 20 mg by mouth daily. 03/26/23  Yes [provider]  sertraline (ZOLOFT) 100 MG tablet Take 100 mg by mouth every morning. 04/16/23  Yes [provider]  VITAMIN D PO Take 1 tablet by mouth daily at 6 (six) AM.   Yes [provider]  pantoprazole (PROTONIX) 40 MG tablet Take 1 tablet (40 mg total) by mouth daily. Patient not taking: Reported on 05/16/2023 08/31/21   Pollyann Savoy, MD    Current Outpatient Medications  Medication Sig Dispense Refill   amLODipine (NORVASC) 5 MG tablet Take 5 mg by mouth daily.     LORazepam (ATIVAN) 1 MG tablet Take 1 tablet (1 mg total) by mouth every 8 (eight) hours. 30 tablet 0   Omega-3  Fatty Acids (FISH OIL) 1200 MG CAPS Take 1 capsule by mouth daily. Takes 1400 mg daily     rosuvastatin (CRESTOR) 20 MG tablet Take 20 mg by mouth daily.     sertraline (ZOLOFT) 100 MG tablet Take 100 mg by mouth every morning.     VITAMIN D PO Take 1 tablet by mouth daily at 6 (six) AM.     pantoprazole (PROTONIX) 40 MG tablet Take 1 tablet (40 mg total) by mouth daily. (Patient not taking: Reported on 05/16/2023) 30 tablet 0   Current Facility-Administered Medications  Medication Dose Route Frequency Provider Last Rate Last Admin   0.9 %  sodium chloride infusion  500 mL Intravenous Continuous Napoleon Form, MD        Allergies as of 05/16/2023 - Review Complete 05/16/2023  Allergen Reaction Noted   Other  06/26/2019    Family History  Problem Relation Age of Onset   Diabetes Sister        x 2   Stroke Paternal Grandfather        54s.   Colon cancer Neg Hx    Colon polyps Neg Hx    Esophageal cancer Neg Hx    Rectal cancer Neg Hx    Stomach cancer Neg Hx     Social History   Socioeconomic History   Marital status: Married    Spouse name:  Not on file   Number of children: Not on file   Years of education: Not on file   Highest education level: Not on file  Occupational History   Not on file  Tobacco Use   Smoking status: Former    Current packs/day: 0.00    Average packs/day: 0.5 packs/day for 12.0 years (6.0 ttl pk-yrs)    Types: Cigarettes    Start date: 109    Quit date: 2008    Years since quitting: 16.9   Smokeless tobacco: Never  Vaping Use   Vaping status: Never Used  Substance and Sexual Activity   Alcohol use: Yes    Comment: rare    Drug use: No   Sexual activity: Yes  Other Topics Concern   Not on file  Social History Narrative   2 daughters -- healthy.    Social Drivers of Corporate investment banker Strain: Low Risk  (05/11/2023)   Received from [facility]   Overall Financial Resource Strain (CARDIA)    Difficulty of Paying  Living Expenses: Not hard at all  Food Insecurity: No Food Insecurity (05/11/2023)   Received from [facility]   Hunger Vital Sign    Worried About Running Out of Food in the Last Year: Never true    Ran Out of Food in the Last Year: Never true  Transportation Needs: No Transportation Needs (05/11/2023)   Received from [facility] - Transportation    Lack of Transportation (Medical): No    Lack of Transportation (Non-Medical): No  Physical Activity: Sufficiently Active (05/11/2023)   Received from [facility]   Exercise Vital Sign    Days of Exercise per Week: 5 days    Minutes of Exercise per Session: 40 min  Stress: No Stress Concern Present (05/11/2023)   Received from [facility] - Occupational Stress Questionnaire    Feeling of Stress : Only a little  Social Connections: Socially Integrated (05/11/2023)   Received from [facility]   Social Network    How would you rate your social network (family, work, friends)?: Good participation with social networks  Intimate Partner Violence: Not At Risk (05/11/2023)   Received from [facility]   HITS    Over the last 12 months how often did your partner physically hurt you?: Never    Over the last 12 months how often did your partner insult you or talk down to you?: Never    Over the last 12 months how often did your partner threaten you with physical harm?: Never    Over the last 12 months how often did your partner scream or curse at you?: Never    Review of Systems:  All other review of systems negative except as mentioned in the HPI.  Physical Exam: Vital signs in last 24 hours: BP 110/78   Pulse 75   Temp 98.2 F (36.8 C) (Temporal)   Ht 6\' 1"  (1.854 m)   Wt 240 lb (108.9 kg)   SpO2 97%   BMI 31.66 kg/m  General:   Alert, NAD Lungs:  Clear .   Heart:  Regular rate and rhythm Abdomen:  Soft, nontender and nondistended. Neuro/Psych:  Alert and  cooperative. Normal mood and affect. A and O x 3  Reviewed labs, radiology imaging, old records and pertinent past GI work up  Patient is appropriate for planned procedure(s) and anesthesia in an ambulatory setting   K. Scherry Ran ,  MD [phone]

## 2023-05-16 NOTE — Progress Notes (Signed)
Called to room to assist during endoscopic procedure.  Patient ID and intended procedure confirmed with present staff. Received instructions for my participation in the procedure from the performing physician.  

## 2023-05-16 NOTE — Progress Notes (Signed)
Pt's states no medical or surgical changes since previsit or office visit. 

## 2023-05-16 NOTE — Op Note (Signed)
[facility] Patient Name: Mikhail Tonner Procedure Date: 05/16/2023 9:35 AM MRN: [id number] Endoscopist: Napoleon Form , MD, [id number] Age: 47 Referring MD:  Date of Birth: Dec 14, 1975 Gender: Male Account #: [id number] Procedure:                Colonoscopy Indications:              Screening for colorectal malignant neoplasm Medicines:                Monitored Anesthesia Care Procedure:                Pre-Anesthesia Assessment:                           - Prior to the procedure, a History and Physical                            was performed, and patient medications and                            allergies were reviewed. The patient's tolerance of                            previous anesthesia was also reviewed. The risks                            and benefits of the procedure and the sedation                            options and risks were discussed with the patient.                            All questions were answered, and informed consent                            was obtained. Prior Anticoagulants: The patient has                            taken no anticoagulant or antiplatelet agents. ASA                            Grade Assessment: II - A patient with mild systemic                            disease. After reviewing the risks and benefits,                            the patient was deemed in satisfactory condition to                            undergo the procedure.                           After obtaining informed consent, the colonoscope  was passed under direct vision. Throughout the                            procedure, the patient's blood pressure, pulse, and                            oxygen saturations were monitored continuously. The                            [id number]-HQ190L Colonoscope [id number] was introduced                            through the anus and advanced to the the cecum,                            identified by  appendiceal orifice and ileocecal                            valve. The colonoscopy was performed without                            difficulty. The patient tolerated the procedure                            well. The quality of the bowel preparation was                            good. The ileocecal valve, appendiceal orifice, and                            rectum were photographed. Scope In: 9:49:15 AM Scope Out: 10:02:57 AM Scope Withdrawal Time: 0 hours 10 minutes 0 seconds  Total Procedure Duration: 0 hours 13 minutes 42 seconds  Findings:                 The perianal and digital rectal examinations were                            normal.                           A 5 mm polyp was found in the sigmoid colon. The                            polyp was sessile. The polyp was removed with a                            cold snare. Resection and retrieval were complete.                           Non-bleeding external and internal hemorrhoids were                            found during retroflexion. The hemorrhoids were  small. Complications:            No immediate complications. Estimated Blood Loss:     Estimated blood loss was minimal. Impression:               - One 5 mm polyp in the sigmoid colon, removed with                            a cold snare. Resected and retrieved.                           - Non-bleeding external and internal hemorrhoids. Recommendation:           - Patient has a contact number available for                            emergencies. The signs and symptoms of potential                            delayed complications were discussed with the                            patient. Return to normal activities tomorrow.                            Written discharge instructions were provided to the                            patient.                           - Resume previous diet.                           - Continue present medications.                            - Await pathology results.                           - Repeat colonoscopy in 5-10 years for surveillance                            based on pathology results. Napoleon Form, MD 05/16/2023 10:08:01 AM This report has been signed electronically.

## 2023-05-16 NOTE — Progress Notes (Signed)
Sedate, gd SR, tolerated procedure well, VSS, report to RN 

## 2023-05-16 NOTE — Patient Instructions (Addendum)
Continue present medications. Await pathology results. Repeat colonoscopy in 5-10 years for surveillance based on pathology results.  Please read over handout about polyps                            YOU HAD AN ENDOSCOPIC PROCEDURE TODAY AT THE  ENDOSCOPY CENTER:   Refer to the procedure report that was given to you for any specific questions about what was found during the examination.  If the procedure report does not answer your questions, please call your gastroenterologist to clarify.  If you requested that your care partner not be given the details of your procedure findings, then the procedure report has been included in a sealed envelope for you to review at your convenience later.  YOU SHOULD EXPECT: Some feelings of bloating in the abdomen. Passage of more gas than usual.  Walking can help get rid of the air that was put into your GI tract during the procedure and reduce the bloating. If you had a lower endoscopy (such as a colonoscopy or flexible sigmoidoscopy) you may notice spotting of blood in your stool or on the toilet paper. If you underwent a bowel prep for your procedure, you may not have a normal bowel movement for a few days.  Please Note:  You might notice some irritation and congestion in your nose or some drainage.  This is from the oxygen used during your procedure.  There is no need for concern and it should clear up in a day or so.  SYMPTOMS TO REPORT IMMEDIATELY:  Following lower endoscopy (colonoscopy or flexible sigmoidoscopy):  Excessive amounts of blood in the stool  Significant tenderness or worsening of abdominal pains  Swelling of the abdomen that is new, acute  Fever of 100F or higher  For urgent or emergent issues, a gastroenterologist can be reached at any hour by calling [phone]. Do not use MyChart messaging for urgent concerns.    DIET:  We do recommend a small meal at first, but then you may proceed to your regular diet.  Drink plenty of  fluids but you should avoid alcoholic beverages for 24 hours.  ACTIVITY:  You should plan to take it easy for the rest of today and you should NOT DRIVE or use heavy machinery until tomorrow (because of the sedation medicines used during the test).    FOLLOW UP: Our staff will call the number listed on your records the next business day following your procedure.  We will call around 7:15- 8:00 am to check on you and address any questions or concerns that you may have regarding the information given to you following your procedure. If we do not reach you, we will leave a message.     If any biopsies were taken you will be contacted by phone or by letter within the next 1-3 weeks.  Please call us at [phone] if you have not heard about the biopsies in 3 weeks.    SIGNATURES/CONFIDENTIALITY: You and/or your care partner have signed paperwork which will be entered into your electronic medical record.  These signatures attest to the fact that that the information above on your After Visit Summary has been reviewed and is understood.  Full responsibility of the confidentiality of this discharge information lies with you and/or your care-partner.

## 2023-05-19 ENCOUNTER — Telehealth: Payer: Self-pay

## 2023-05-19 NOTE — Telephone Encounter (Signed)
TC placed, VM obtained and message left.

## 2023-05-20 LAB — SURGICAL PATHOLOGY

## 2023-06-24 ENCOUNTER — Encounter: Payer: Self-pay | Admitting: Gastroenterology
